# Patient Record
Sex: Female | Born: 1950 | Race: Black or African American | Hispanic: No | Marital: Married | Smoking: Never smoker
Health system: Southern US, Community
[De-identification: ages and names within clinical notes are randomized; demographics above are authoritative.]

## PROBLEM LIST (undated history)

## (undated) DIAGNOSIS — Z853 Personal history of malignant neoplasm of breast: Secondary | ICD-10-CM

## (undated) DIAGNOSIS — Z806 Family history of leukemia: Secondary | ICD-10-CM

## (undated) DIAGNOSIS — C801 Malignant (primary) neoplasm, unspecified: Secondary | ICD-10-CM

## (undated) DIAGNOSIS — Z803 Family history of malignant neoplasm of breast: Secondary | ICD-10-CM

## (undated) DIAGNOSIS — Z8042 Family history of malignant neoplasm of prostate: Secondary | ICD-10-CM

## (undated) HISTORY — DX: Family history of leukemia: Z80.6

## (undated) HISTORY — DX: Family history of malignant neoplasm of breast: Z80.3

## (undated) HISTORY — DX: Personal history of malignant neoplasm of breast: Z85.3

## (undated) HISTORY — DX: Family history of malignant neoplasm of prostate: Z80.42

---

## 2003-12-23 ENCOUNTER — Ambulatory Visit (HOSPITAL_COMMUNITY): Admission: RE | Admit: 2003-12-23 | Discharge: 2003-12-23 | Payer: Self-pay | Admitting: Gastroenterology

## 2006-09-06 ENCOUNTER — Encounter: Admission: RE | Admit: 2006-09-06 | Discharge: 2006-09-06 | Payer: Self-pay | Admitting: Cardiology

## 2007-04-26 ENCOUNTER — Emergency Department (HOSPITAL_COMMUNITY): Admission: EM | Admit: 2007-04-26 | Discharge: 2007-04-26 | Payer: Self-pay | Admitting: Emergency Medicine

## 2007-08-06 ENCOUNTER — Other Ambulatory Visit: Admission: RE | Admit: 2007-08-06 | Discharge: 2007-08-06 | Payer: Self-pay | Admitting: Obstetrics and Gynecology

## 2008-08-19 ENCOUNTER — Ambulatory Visit: Payer: Self-pay | Admitting: Family Medicine

## 2008-08-26 ENCOUNTER — Ambulatory Visit: Payer: Self-pay | Admitting: Family Medicine

## 2008-09-02 ENCOUNTER — Ambulatory Visit: Payer: Self-pay | Admitting: Family Medicine

## 2008-09-16 ENCOUNTER — Ambulatory Visit: Payer: Self-pay | Admitting: Family Medicine

## 2008-09-29 ENCOUNTER — Ambulatory Visit: Payer: Self-pay | Admitting: Family Medicine

## 2008-10-08 ENCOUNTER — Ambulatory Visit: Payer: Self-pay | Admitting: Family Medicine

## 2008-10-20 ENCOUNTER — Ambulatory Visit: Payer: Self-pay | Admitting: Family Medicine

## 2008-10-30 ENCOUNTER — Ambulatory Visit: Payer: Self-pay | Admitting: Family Medicine

## 2009-01-12 ENCOUNTER — Ambulatory Visit: Payer: Self-pay | Admitting: Family Medicine

## 2009-01-20 ENCOUNTER — Ambulatory Visit: Payer: Self-pay | Admitting: Family Medicine

## 2009-01-28 ENCOUNTER — Ambulatory Visit: Payer: Self-pay | Admitting: Family Medicine

## 2009-12-24 ENCOUNTER — Encounter: Admission: RE | Admit: 2009-12-24 | Discharge: 2009-12-24 | Payer: Self-pay | Admitting: Internal Medicine

## 2010-08-05 NOTE — Op Note (Signed)
Jeanette Krause, HALLINAN              ACCOUNT NO.:  [id number]   MEDICAL RECORD NO.:  [id number]          PATIENT TYPE:  AMB   LOCATION:  ENDO                         FACILITY:  [facility]   PHYSICIAN:  Graylin Shiver, M.D.   DATE OF BIRTH:  May 07, 1950   DATE OF PROCEDURE:  12/23/2003  DATE OF DISCHARGE:                                 OPERATIVE REPORT   PROCEDURE PERFORMED:  Colonoscopy.   INDICATIONS FOR PROCEDURE:  Screening.   Informed consent was obtained after explanation of the risks of bleeding,  infection, and perforation.   PREMEDICATIONS:  Fentanyl 75 mcg  IV, Versed 6 mg IV.   DESCRIPTION OF PROCEDURE:  With the patient in the left lateral decubitus  position, a rectal exam was performed and no masses were felt.  The Olympus  colonoscope was inserted into the rectum and advanced around the colon to  the cecum.  Cecal landmarks were identified.  The cecum and ascending colon  were normal.  The transverse colon normal.  The descending colon, sigmoid  and rectum were normal.  The patient tolerated the procedure well without  complications.   IMPRESSION:  Normal colonoscopy to the cecum.   I would recommend a follow-up colonoscopy again in five years due to the  patient's family history of colon polyps in her mother.      SFG/MEDQ  D:  12/23/2003  T:  12/23/2003  Job:  [id number]   cc:   Dellis Anes. Idell Pickles, M.D.  [address]  [place]  [place] [address]  Fax: [phone]

## 2011-05-19 ENCOUNTER — Other Ambulatory Visit: Payer: Self-pay | Admitting: Radiology

## 2011-05-19 DIAGNOSIS — C50911 Malignant neoplasm of unspecified site of right female breast: Secondary | ICD-10-CM

## 2011-05-25 ENCOUNTER — Other Ambulatory Visit: Payer: Self-pay

## 2012-01-01 ENCOUNTER — Other Ambulatory Visit (HOSPITAL_COMMUNITY)
Admission: RE | Admit: 2012-01-01 | Discharge: 2012-01-01 | Disposition: A | Discharge status: Home / Self Care | Payer: BC Managed Care – PPO | Source: Ambulatory Visit | Attending: Obstetrics and Gynecology | Admitting: Obstetrics and Gynecology

## 2012-01-01 DIAGNOSIS — Z01419 Encounter for gynecological examination (general) (routine) without abnormal findings: Secondary | ICD-10-CM | POA: Insufficient documentation

## 2012-04-17 IMAGING — CT CT ANGIO CHEST
2 of 6 series · 18 of 36 positions shown · IV contrast ([ID] OMNI 300)
Comparison: None.

CLINICAL DATA: Chest pain.

CT ANGIOGRAPHY CHEST WITH CONTRAST
TECHNIQUE: Multidetector CT imaging of the chest was performed
using the standard protocol during bolus administration of
intravenous contrast.  Multiplanar CT image reconstructions
including MIPs were obtained to evaluate the vascular anatomy.
Contrast:  100 ml Omnipaque 300 IV.

[Series 4: pe 1.25 · axial · 0.64mm/px · z∈[-241,-48]mm · 17 of 175 slices shown]
[im 10/175  lung]
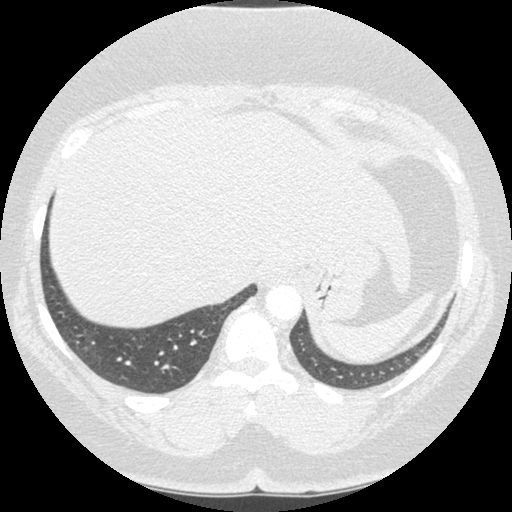
[im 20/175  mediastinal]
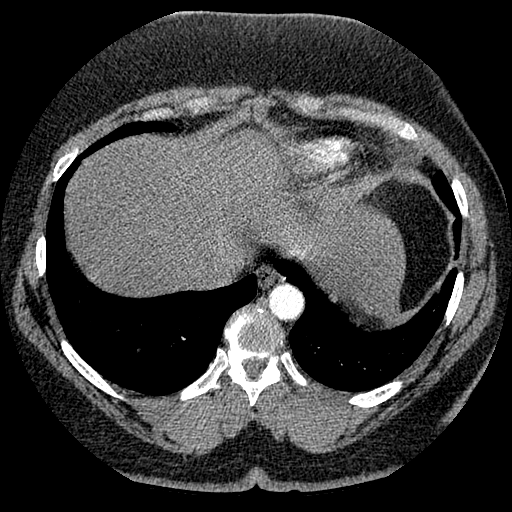
[im 30/175  lung]
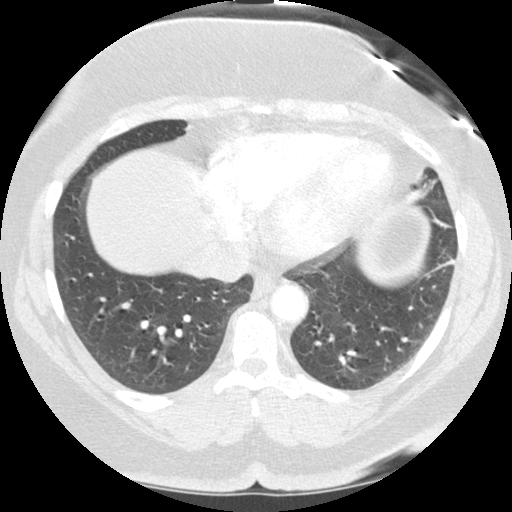
[im 39/175  mediastinal]
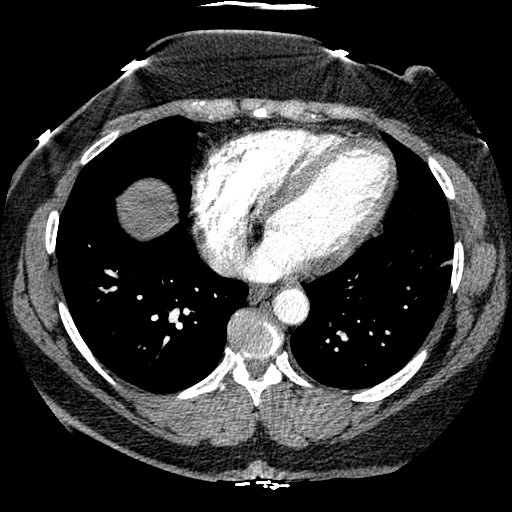
[im 49/175  lung]
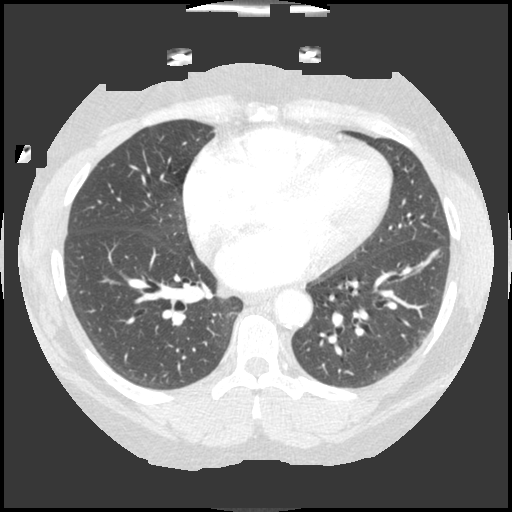
[im 59/175  mediastinal]
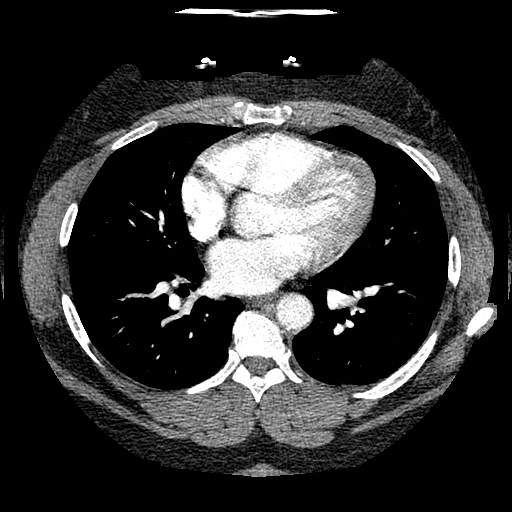
[im 68/175  lung]
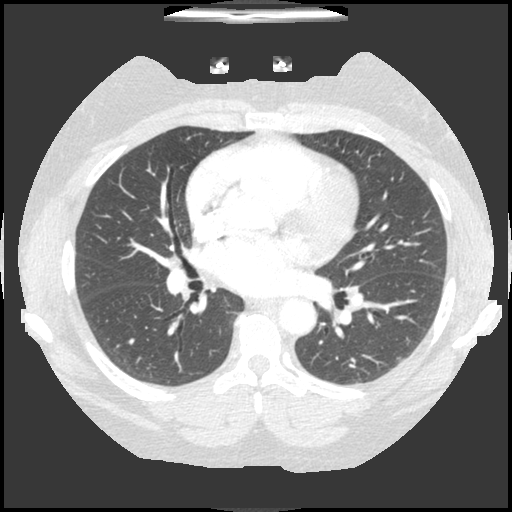
[im 78/175  mediastinal]
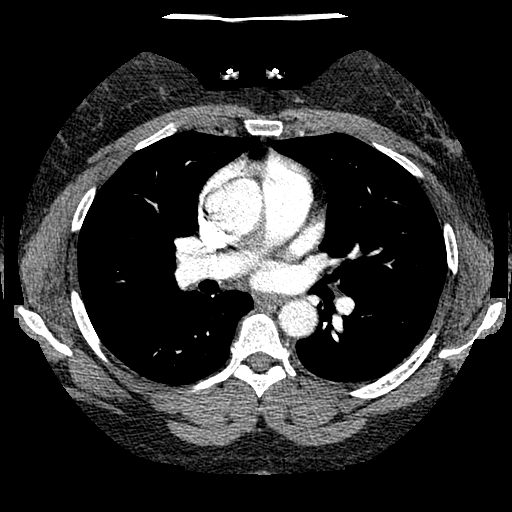
[im 88/175  lung]
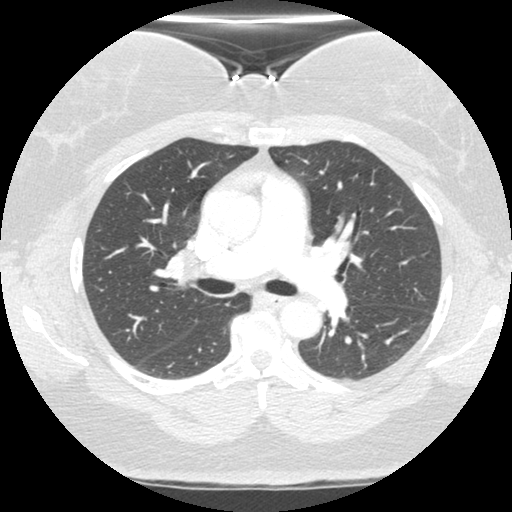
[im 97/175  mediastinal]
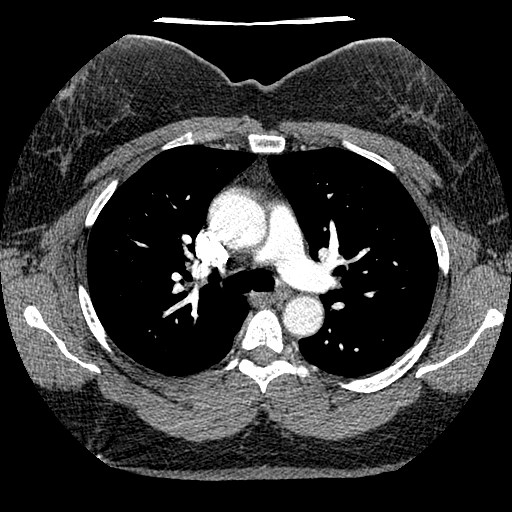
[im 107/175  lung]
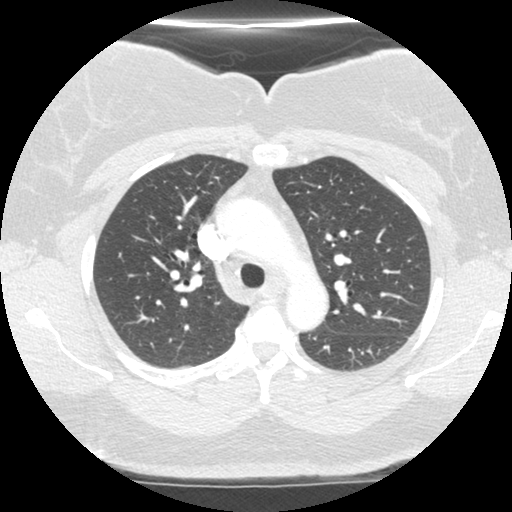
[im 117/175  mediastinal]
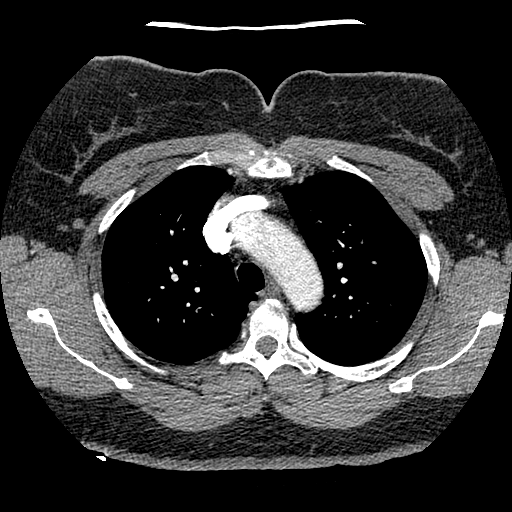
[im 126/175  lung]
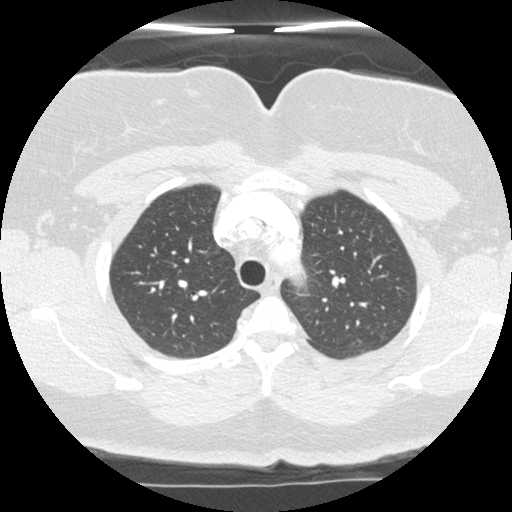
[im 136/175  mediastinal]
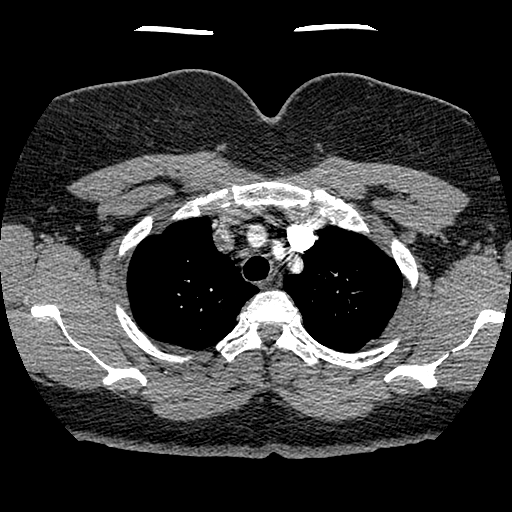
[im 146/175  lung]
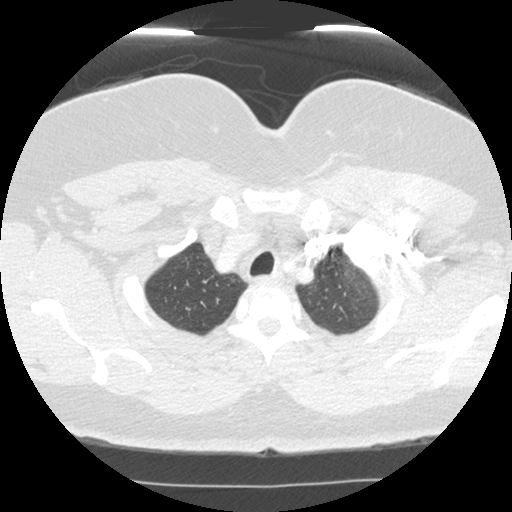
[im 155/175  mediastinal]
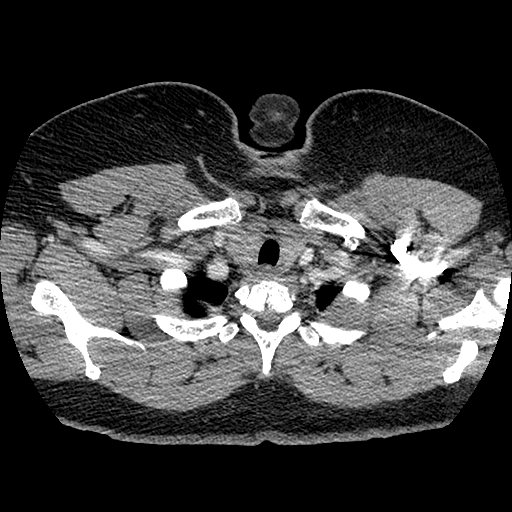
[im 165/175  lung]
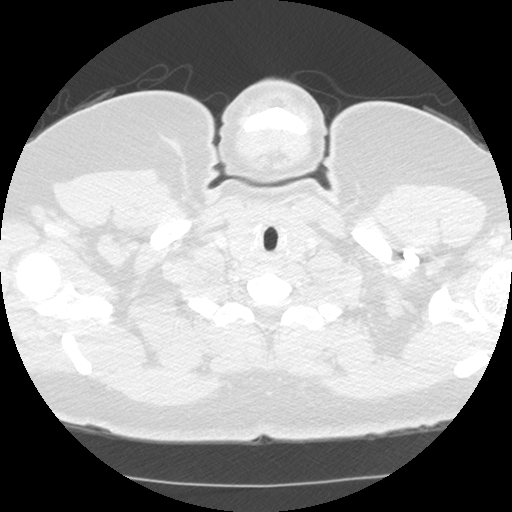

[Series 105: cor · coronal · 0.64mm/px · 1 of 141 slices shown]
[im 71/141  mediastinal]
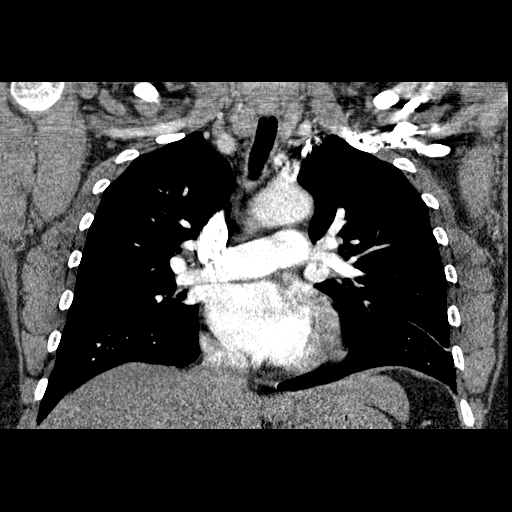

[18 of 36 positions shown; findings below may reference images not displayed]

FINDINGS: No filling defects in the pulmonary arteries to suggest
pulmonary emboli. Heart is normal size. Aorta is normal caliber. No
mediastinal, hilar, or axillary adenopathy.  Visualized thyroid and
chest wall soft tissues unremarkable. Linear densities are noted in
the left lung base compatible with scarring.  Lungs otherwise
clear.  No effusions.  There is

Imaging into the upper abdomen shows no acute findings.  No acute
bony abnormality.  Degenerative changes in the thoracic spine.

Review of the MIP images confirms the above findings.
IMPRESSION: No evidence of pulmonary embolus.

Minimal left base scarring.

No acute findings.

## 2014-02-24 ENCOUNTER — Other Ambulatory Visit: Payer: Self-pay | Admitting: Nurse Practitioner

## 2014-02-24 ENCOUNTER — Ambulatory Visit
Admission: RE | Admit: 2014-02-24 | Discharge: 2014-02-24 | Disposition: A | Discharge status: Home / Self Care | Payer: BC Managed Care – PPO | Source: Ambulatory Visit | Attending: Nurse Practitioner | Admitting: Nurse Practitioner

## 2014-02-24 DIAGNOSIS — J40 Bronchitis, not specified as acute or chronic: Secondary | ICD-10-CM

## 2014-08-12 ENCOUNTER — Other Ambulatory Visit (HOSPITAL_COMMUNITY)
Admission: RE | Admit: 2014-08-12 | Discharge: 2014-08-12 | Disposition: A | Discharge status: Home / Self Care | Payer: BC Managed Care – PPO | Source: Ambulatory Visit | Attending: Obstetrics and Gynecology | Admitting: Obstetrics and Gynecology

## 2014-08-12 ENCOUNTER — Other Ambulatory Visit: Payer: Self-pay | Admitting: Obstetrics and Gynecology

## 2014-08-12 DIAGNOSIS — R87810 Cervical high risk human papillomavirus (HPV) DNA test positive: Secondary | ICD-10-CM | POA: Insufficient documentation

## 2014-08-12 DIAGNOSIS — Z01419 Encounter for gynecological examination (general) (routine) without abnormal findings: Secondary | ICD-10-CM | POA: Insufficient documentation

## 2014-08-12 DIAGNOSIS — Z1151 Encounter for screening for human papillomavirus (HPV): Secondary | ICD-10-CM | POA: Diagnosis present

## 2014-08-13 LAB — CYTOLOGY - PAP

## 2015-08-18 ENCOUNTER — Other Ambulatory Visit: Payer: Self-pay | Admitting: Obstetrics and Gynecology

## 2015-08-18 ENCOUNTER — Other Ambulatory Visit (HOSPITAL_COMMUNITY)
Admission: RE | Admit: 2015-08-18 | Discharge: 2015-08-18 | Disposition: A | Discharge status: Home / Self Care | Payer: BC Managed Care – PPO | Source: Ambulatory Visit | Attending: Obstetrics and Gynecology | Admitting: Obstetrics and Gynecology

## 2015-08-18 DIAGNOSIS — Z01419 Encounter for gynecological examination (general) (routine) without abnormal findings: Secondary | ICD-10-CM | POA: Diagnosis not present

## 2015-08-19 LAB — CYTOLOGY - PAP

## 2015-11-05 ENCOUNTER — Encounter (HOSPITAL_COMMUNITY): Payer: Self-pay | Admitting: Emergency Medicine

## 2015-11-05 ENCOUNTER — Emergency Department (HOSPITAL_COMMUNITY)
Admission: EM | Admit: 2015-11-05 | Discharge: 2015-11-05 | Disposition: A | Discharge status: Home / Self Care | Payer: BC Managed Care – PPO | Attending: Emergency Medicine | Admitting: Emergency Medicine

## 2015-11-05 ENCOUNTER — Emergency Department (HOSPITAL_COMMUNITY): Payer: BC Managed Care – PPO

## 2015-11-05 DIAGNOSIS — R1011 Right upper quadrant pain: Secondary | ICD-10-CM | POA: Insufficient documentation

## 2015-11-05 DIAGNOSIS — Z853 Personal history of malignant neoplasm of breast: Secondary | ICD-10-CM | POA: Insufficient documentation

## 2015-11-05 DIAGNOSIS — Z91010 Allergy to peanuts: Secondary | ICD-10-CM | POA: Diagnosis not present

## 2015-11-05 DIAGNOSIS — R109 Unspecified abdominal pain: Secondary | ICD-10-CM | POA: Diagnosis present

## 2015-11-05 HISTORY — DX: Malignant (primary) neoplasm, unspecified: C80.1

## 2015-11-05 LAB — COMPREHENSIVE METABOLIC PANEL
ALK PHOS: 69 U/L (ref 38–126)
ALT: 51 U/L (ref 14–54)
ANION GAP: 5 (ref 5–15)
AST: 49 U/L — ABNORMAL HIGH (ref 15–41)
Albumin: 3.6 g/dL (ref 3.5–5.0)
BILIRUBIN TOTAL: 0.2 mg/dL — AB (ref 0.3–1.2)
BUN: 8 mg/dL (ref 6–20)
CALCIUM: 9.6 mg/dL (ref 8.9–10.3)
CO2: 26 mmol/L (ref 22–32)
Chloride: 107 mmol/L (ref 101–111)
Creatinine, Ser: 0.92 mg/dL (ref 0.44–1.00)
GLUCOSE: 120 mg/dL — AB (ref 65–99)
Potassium: 4.1 mmol/L (ref 3.5–5.1)
Sodium: 138 mmol/L (ref 135–145)
TOTAL PROTEIN: 8.2 g/dL — AB (ref 6.5–8.1)

## 2015-11-05 LAB — URINALYSIS, ROUTINE W REFLEX MICROSCOPIC
BILIRUBIN URINE: NEGATIVE
Glucose, UA: NEGATIVE mg/dL
Hgb urine dipstick: NEGATIVE
Ketones, ur: NEGATIVE mg/dL
LEUKOCYTES UA: NEGATIVE
NITRITE: NEGATIVE
Protein, ur: NEGATIVE mg/dL
SPECIFIC GRAVITY, URINE: 1.016 (ref 1.005–1.030)
pH: 7 (ref 5.0–8.0)

## 2015-11-05 LAB — CBC
HCT: 34.5 % — ABNORMAL LOW (ref 36.0–46.0)
HEMOGLOBIN: 11.8 g/dL — AB (ref 12.0–15.0)
MCH: 32.6 pg (ref 26.0–34.0)
MCHC: 34.2 g/dL (ref 30.0–36.0)
MCV: 95.3 fL (ref 78.0–100.0)
Platelets: 260 10*3/uL (ref 150–400)
RBC: 3.62 MIL/uL — AB (ref 3.87–5.11)
RDW: 13.6 % (ref 11.5–15.5)
WBC: 8 10*3/uL (ref 4.0–10.5)

## 2015-11-05 LAB — LIPASE, BLOOD: Lipase: 21 U/L (ref 11–51)

## 2015-11-05 MED ORDER — ONDANSETRON HCL 4 MG/2ML IJ SOLN
4.0000 mg | Freq: Once | INTRAMUSCULAR | Status: AC
  Administered 2015-11-05: 4 mg via INTRAVENOUS
  Filled 2015-11-05: qty 2

## 2015-11-05 MED ORDER — ONDANSETRON HCL 4 MG PO TABS: 4.0000 mg | ORAL_TABLET | Freq: Three times a day (TID) | ORAL | 0 refills | Status: AC | PRN

## 2015-11-05 MED ORDER — SODIUM CHLORIDE 0.9 % IV BOLUS (SEPSIS)
1000.0000 mL | Freq: Once | INTRAVENOUS | Status: AC
  Administered 2015-11-05: 1000 mL via INTRAVENOUS

## 2015-11-05 MED ORDER — FENTANYL CITRATE (PF) 100 MCG/2ML IJ SOLN
100.0000 ug | Freq: Once | INTRAMUSCULAR | Status: AC
  Administered 2015-11-05: 100 ug via INTRAVENOUS
  Filled 2015-11-05: qty 2

## 2015-11-05 NOTE — ED Provider Notes (Addendum)
[facility] DEPT Provider Note   CSN: [id number] Arrival date & time: 11/05/15  1240     History   Chief Complaint Chief Complaint  Patient presents with  . Abdominal Pain    HPI Jeanette Krause is a 65 y.o. female.  65 year old female history of breast cancer presented to the emergency department today with less than 12 hours of right-sided abdominal pain associated with 3 episodes of vomiting and decreased appetite. Took some Gas-X with moderate relief still rates the pain as a 4 out of 10 on my evaluation. Last normal bowel movement was this morning. She has felt chills but no measured fevers. No other associated symptoms. Pain seems to be worse when she is walking around she also seems to be little bit more weak than normal. No history of the same. No other modifying factors.    Abdominal Pain   Associated symptoms include vomiting. Pertinent negatives include fever, diarrhea, nausea and constipation.    Past Medical History:  Diagnosis Date  . Cancer [facility])    breast    There are no active problems to display for this patient.   History reviewed. No pertinent surgical history.  OB History    No data available       Home Medications    Prior to Admission medications   Medication Sig Start Date End Date Taking? Authorizing Provider  Ascorbic Acid (VITAMIN C) 1000 MG tablet Take 1,000 mg by mouth daily.   Yes Historical Provider, MD  Calcium-Vitamin D-Vitamin K (CALCIUM SOFT CHEWS) 500-500-40 MG-UNT-MCG CHEW Chew 2 tablets by mouth daily.   Yes Historical Provider, MD  cetirizine (ZYRTEC) 10 MG tablet Take 10 mg by mouth daily.   Yes Historical Provider, MD  Cholecalciferol (VITAMIN D3) 10000 units TABS Take 5 tablets by mouth once a week.    Yes Historical Provider, MD  CHROMIUM PO Take 600 mg by mouth daily.   Yes Historical Provider, MD  Coenzyme Q10 (COQ-10) 50 MG CAPS Take 100 mg by mouth daily.   Yes Historical Provider, MD  fluticasone (FLONASE) 50  MCG/ACT nasal spray Place 1-2 sprays into both nostrils daily.   Yes Historical Provider, MD  letrozole (FEMARA) 2.5 MG tablet Take 2.5 mg by mouth daily. 10/29/15  Yes Historical Provider, MD  levothyroxine (SYNTHROID, LEVOTHROID) 100 MCG tablet Take 100 mcg by mouth every morning.   Yes Historical Provider, MD  MAGNESIUM GLYCINATE PLUS PO Take 1 capsule by mouth daily.   Yes Historical Provider, MD  Menaquinone-7 (VITAMIN K2) 40 MCG TABS Take 40 mcg by mouth daily.   Yes Historical Provider, MD  Multiple Vitamin (MULTI-VITAMINS) TABS Take 1 tablet by mouth daily with breakfast.   Yes Historical Provider, MD  Probiotic CAPS Take 1 capsule by mouth daily.   Yes Historical Provider, MD  selenium 50 MCG TABS tablet Take 50 mcg by mouth daily.   Yes Historical Provider, MD  VITAMIN A PO Take 4 capsules by mouth daily.   Yes Historical Provider, MD    Family History No family history on file.  Social History Social History  Substance Use Topics  . Smoking status: Never Smoker  . Smokeless tobacco: Never Used  . Alcohol use Not on file     Allergies   Fruit extracts; Pollen extract; Shellfish allergy; Peanut-containing drug products; and Penicillins   Review of Systems Review of Systems  Constitutional: Positive for chills. Negative for fever.  Respiratory: Negative for cough and shortness of breath.   Gastrointestinal:  Positive for abdominal pain and vomiting. Negative for constipation, diarrhea and nausea.  All other systems reviewed and are negative.    Physical Exam Updated Vital Signs BP 140/91   Pulse 80   Temp 98.6 F (37 C) (Oral)   Resp 18   SpO2 99%   Physical Exam  Constitutional: She appears well-developed and well-nourished. No distress.  HENT:  Head: Normocephalic and atraumatic.  Eyes: Conjunctivae are normal.  Neck: Neck supple.  Cardiovascular: Normal rate and regular rhythm.   No murmur heard. Pulmonary/Chest: Effort normal and breath sounds normal. No  respiratory distress.  Abdominal: Soft. She exhibits no distension and no mass. There is tenderness (RUQ). There is guarding (slight involuntary). There is no rebound.  Musculoskeletal: She exhibits no edema.  Neurological: She is alert.  Skin: Skin is warm and dry. No rash noted. No erythema.  Psychiatric: She has a normal mood and affect.  Nursing note and vitals reviewed.    ED Treatments / Results  Labs (all labs ordered are listed, but only abnormal results are displayed) Labs Reviewed  COMPREHENSIVE METABOLIC PANEL - Abnormal; Notable for the following:       Result Value   Glucose, Bld 120 (*)    Total Protein 8.2 (*)    AST 49 (*)    Total Bilirubin 0.2 (*)    All other components within normal limits  CBC - Abnormal; Notable for the following:    RBC 3.62 (*)    Hemoglobin 11.8 (*)    HCT 34.5 (*)    All other components within normal limits  URINALYSIS, ROUTINE W REFLEX MICROSCOPIC (NOT AT [facility]) - Abnormal; Notable for the following:    APPearance CLOUDY (*)    All other components within normal limits  LIPASE, BLOOD    EKG  EKG Interpretation None       Radiology US Abdomen Limited Ruq  Result Date: 11/05/2015 CLINICAL DATA:  Right upper quadrant pain. EXAM: US ABDOMEN LIMITED - RIGHT UPPER QUADRANT COMPARISON:  09/06/2006. FINDINGS: Gallbladder: No gallstones or wall thickening visualized. Gallbladder folds noted. No sonographic Murphy sign noted by sonographer. Common bile duct: Diameter: 4.4 mm Liver: There is again noted to be echogenic consistent fatty infiltration and/or hepatocellular disease. No focal hepatic abnormality identified. IMPRESSION: The liver is again noted to be echogenic consistent fatty infiltration and/or hepatocellular disease. Exam is otherwise unremarkable. No gallstones or biliary distention. Electronically Signed   By: Marcello Moores  Register   On: 11/05/2015 17:00    Procedures Procedures (including critical care time)  Medications  Ordered in ED Medications  sodium chloride 0.9 % bolus 1,000 mL (1,000 mLs Intravenous New Bag/Given 11/05/15 1612)  fentaNYL (SUBLIMAZE) injection 100 mcg (100 mcg Intravenous Given 11/05/15 1612)  ondansetron (ZOFRAN) injection 4 mg (4 mg Intravenous Given 11/05/15 1612)     Initial Impression / Assessment and Plan / ED Course  I have reviewed the triage vital signs and the nursing notes.  Pertinent labs & imaging results that were available during my care of the patient were reviewed by me and considered in my medical decision making (see chart for details).  Gas vs biliary colic. Plan for labs/pain meds/fluids/US. If all ok will reeval for relief. Less likely appendicitis at this time with no RLQ ttp.   Clinical Course    Reevaluation, pain dissipated. Abdomen benign. [place] negative. Labs ok. Still doubt appendicitis However the patient will return here if the pain comes back, worsens, persistent vomiting, fever, chills or lethargy.  I will discharge her home with prescription for Zofran with PCP follow-up.  Final Clinical Impressions(s) / ED Diagnoses   Final diagnoses:  RUQ abdominal pain  Right upper quadrant pain    New Prescriptions New Prescriptions   No medications on file     Merrily Pew, MD 11/05/15 1752

## 2015-11-05 NOTE — ED Triage Notes (Signed)
Rt side abd pain since this am vomiting hurts to walk last bm tthis am good denies dysuria

## 2015-11-05 NOTE — ED Notes (Signed)
Dr. Mesner at bedside   

## 2016-06-18 IMAGING — CR DG CHEST 2V
2 series · 2 of 2 positions shown · non-contrast
Comparison: 12/24/2009

CLINICAL DATA: Cough and wheezing for 3 weeks

EXAM:
CHEST  2 VIEW

[w chest pa *]
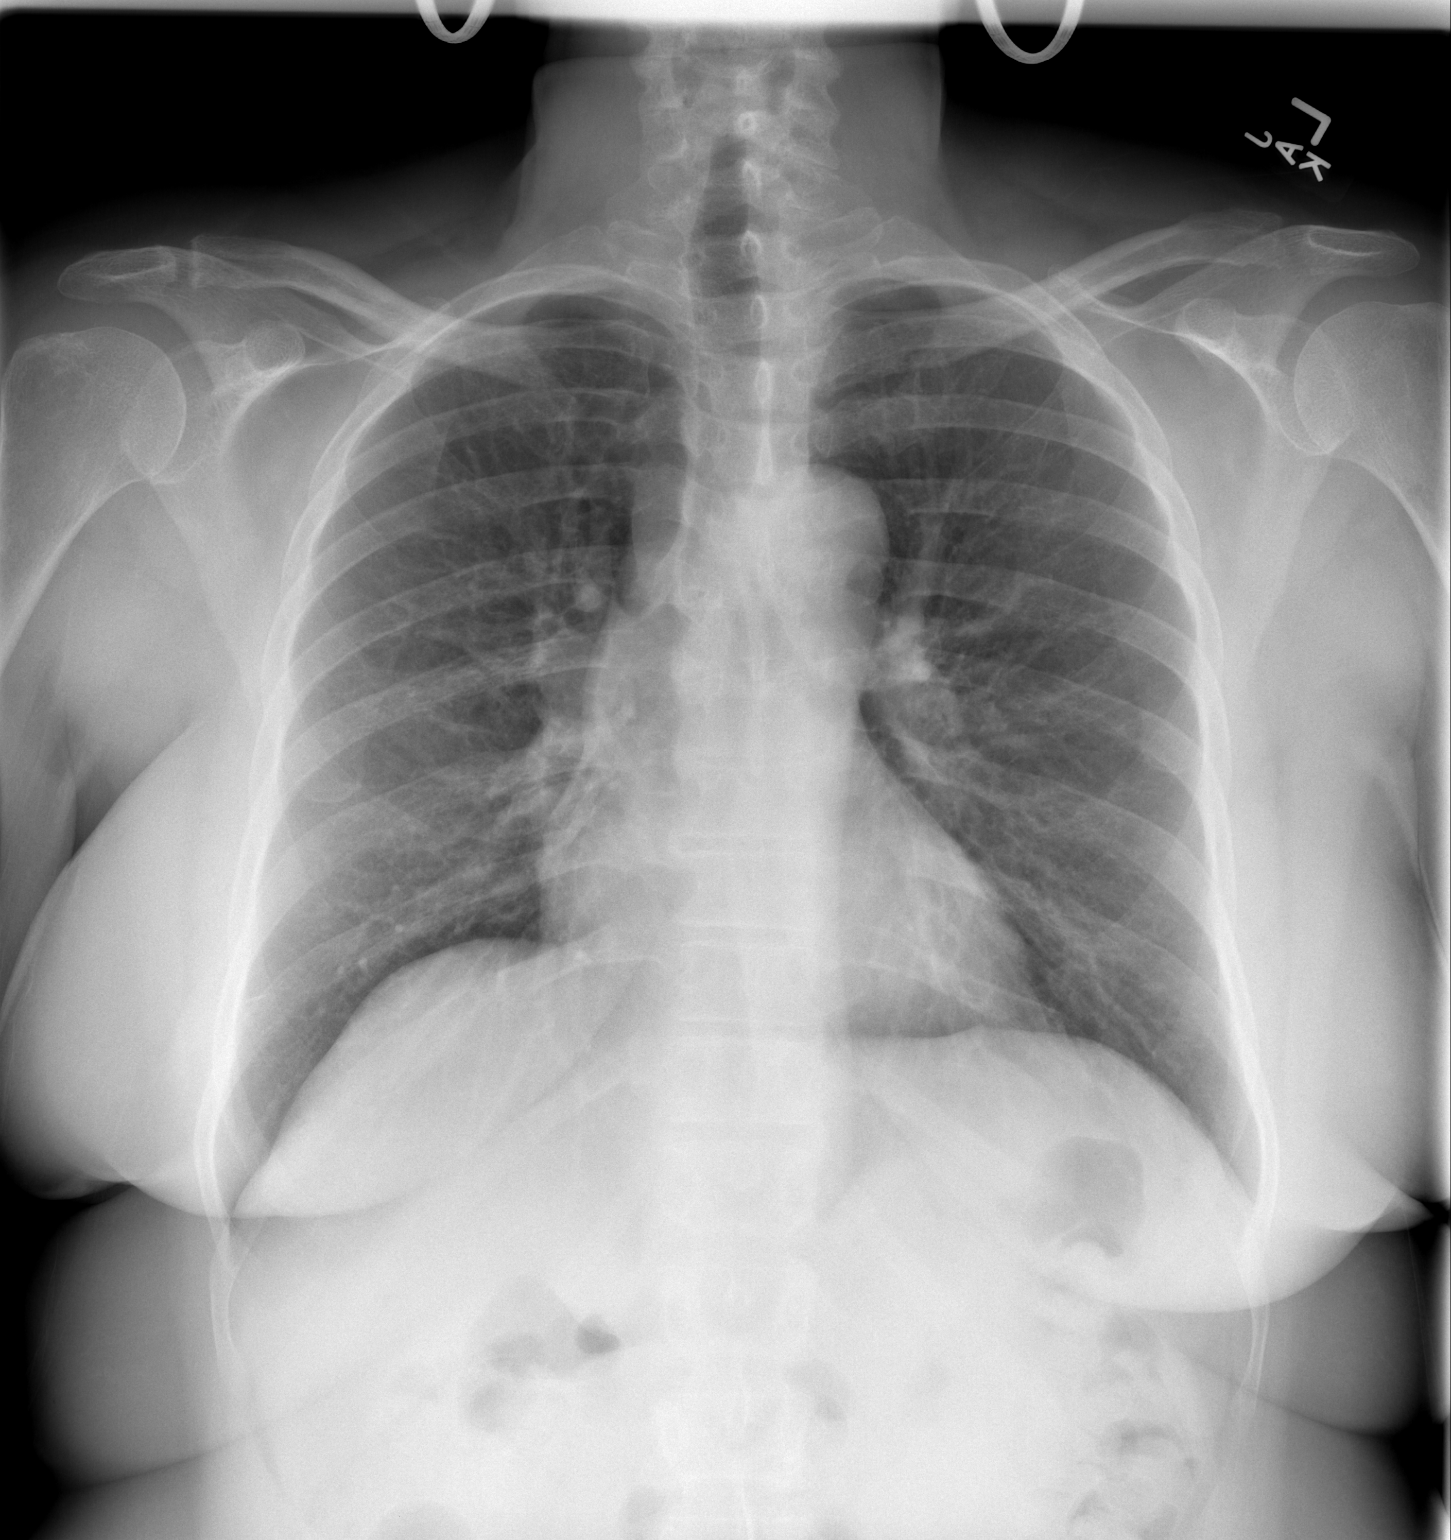

[w chest lat]
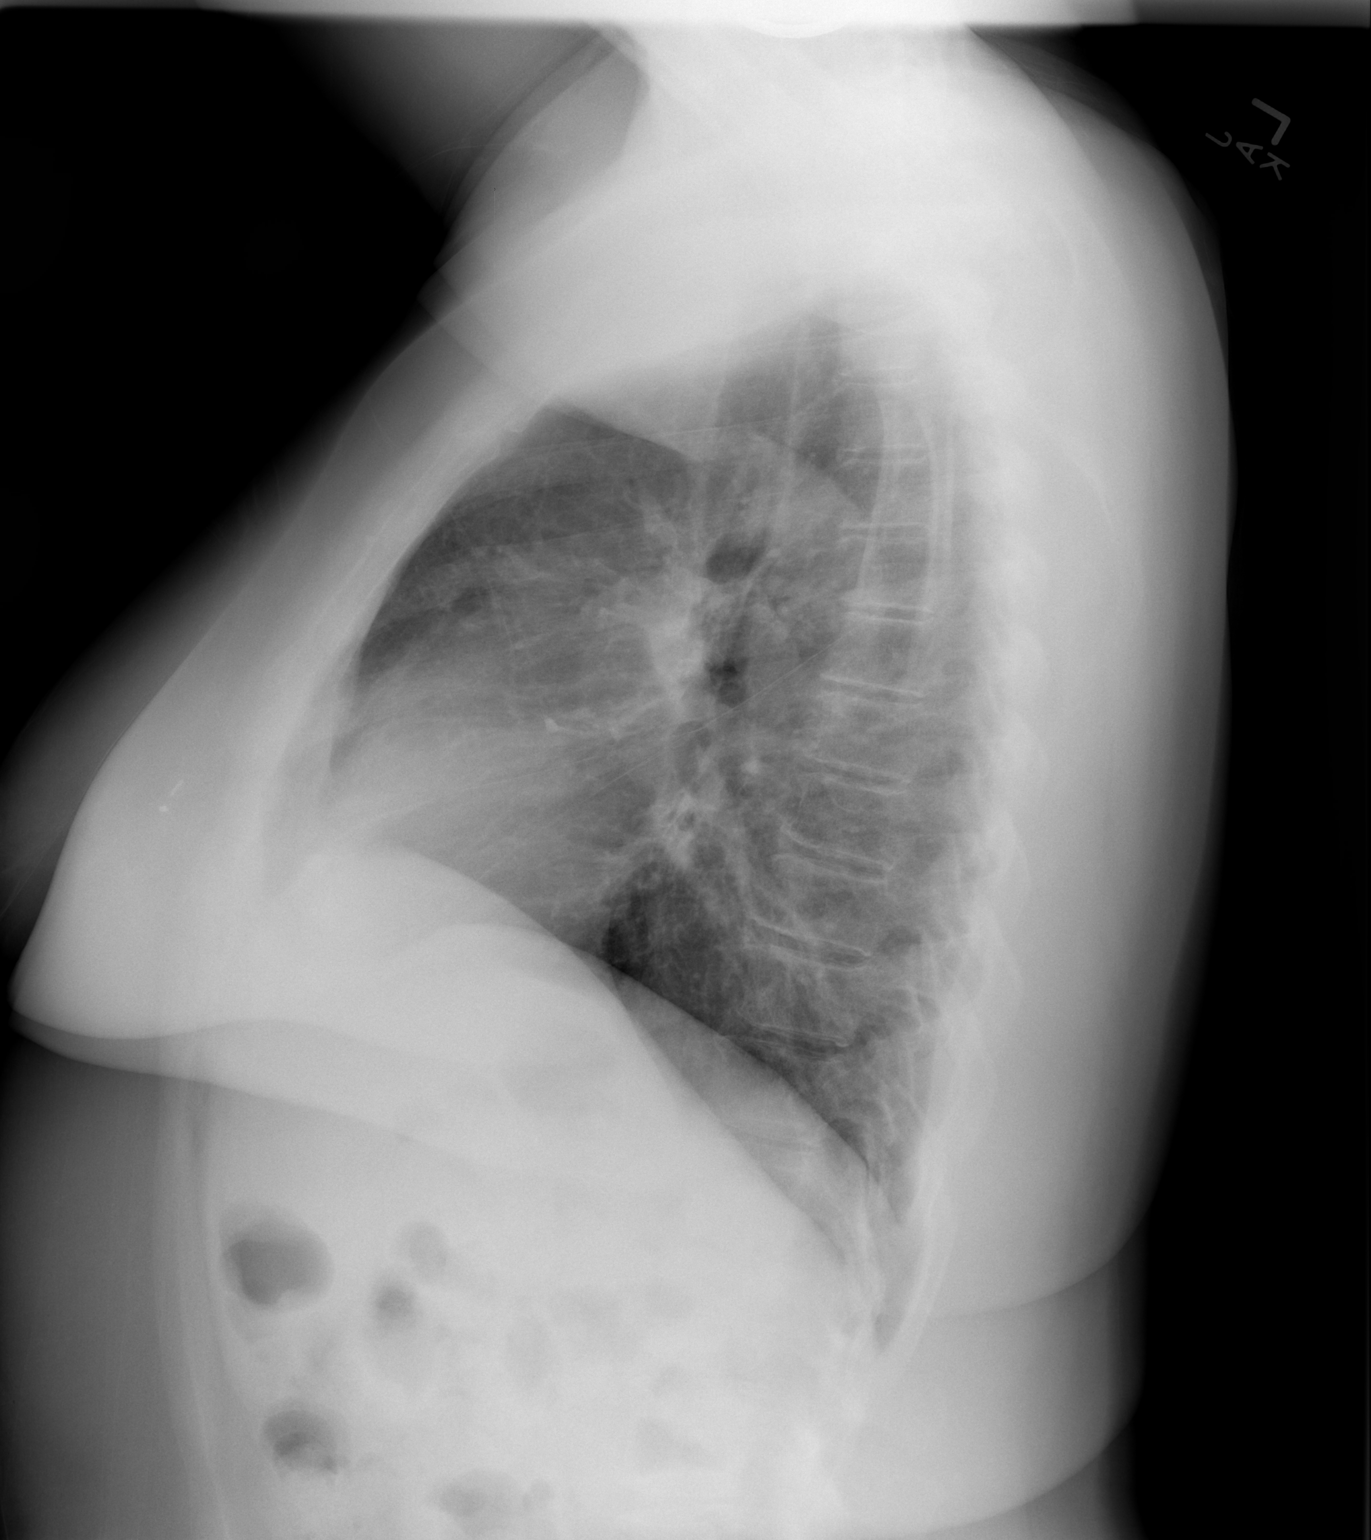

[2 of 2 positions shown; findings below may reference images not displayed]

FINDINGS: The heart size and mediastinal contours are within normal limits.
Both lungs are clear. The visualized skeletal structures are
unremarkable.
IMPRESSION: No active cardiopulmonary disease.

## 2016-08-21 ENCOUNTER — Other Ambulatory Visit (HOSPITAL_COMMUNITY)
Admission: RE | Admit: 2016-08-21 | Discharge: 2016-08-21 | Disposition: A | Discharge status: Home / Self Care | Payer: BC Managed Care – PPO | Source: Ambulatory Visit | Attending: Obstetrics and Gynecology | Admitting: Obstetrics and Gynecology

## 2016-08-21 ENCOUNTER — Other Ambulatory Visit: Payer: Self-pay | Admitting: Obstetrics and Gynecology

## 2016-08-21 DIAGNOSIS — Z01419 Encounter for gynecological examination (general) (routine) without abnormal findings: Secondary | ICD-10-CM | POA: Insufficient documentation

## 2016-08-23 LAB — CYTOLOGY - PAP: DIAGNOSIS: NEGATIVE

## 2016-12-05 IMAGING — US US ABDOMEN LIMITED
1 series · 14 of 25 positions shown · non-contrast
Comparison: 09/06/2006.

CLINICAL DATA: Right upper quadrant pain.

EXAM:
US ABDOMEN LIMITED - RIGHT UPPER QUADRANT

[Series 1: us abdomen limited · 0.25mm/px · 14 of 52 slices shown]
[im 1/52]
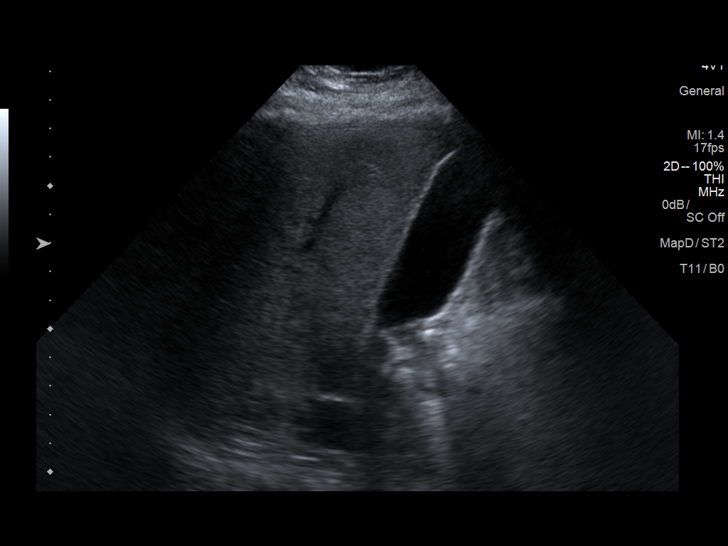
[im 5/52]
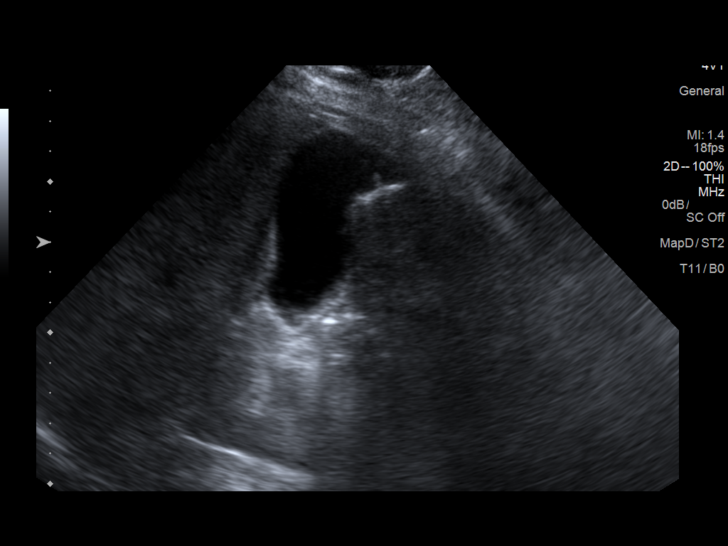
[im 9/52]
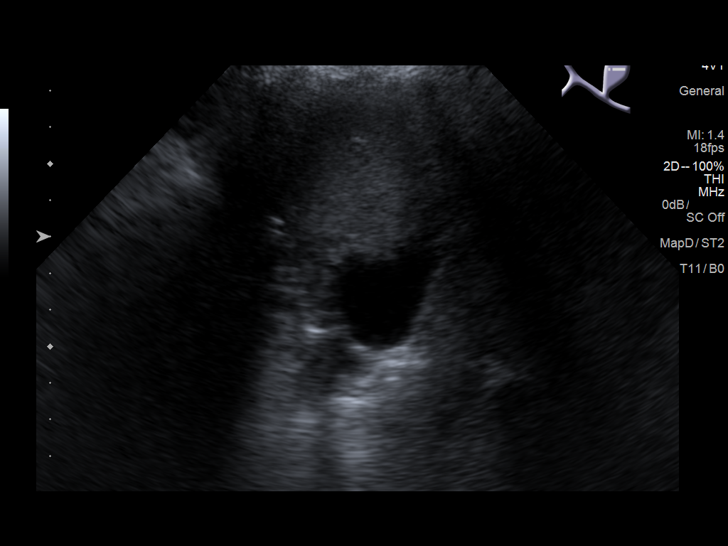
[im 13/52]
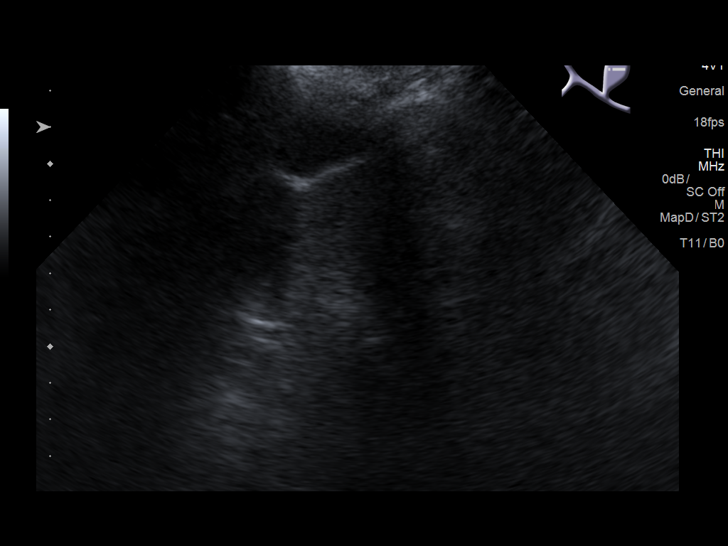
[im 18/52]
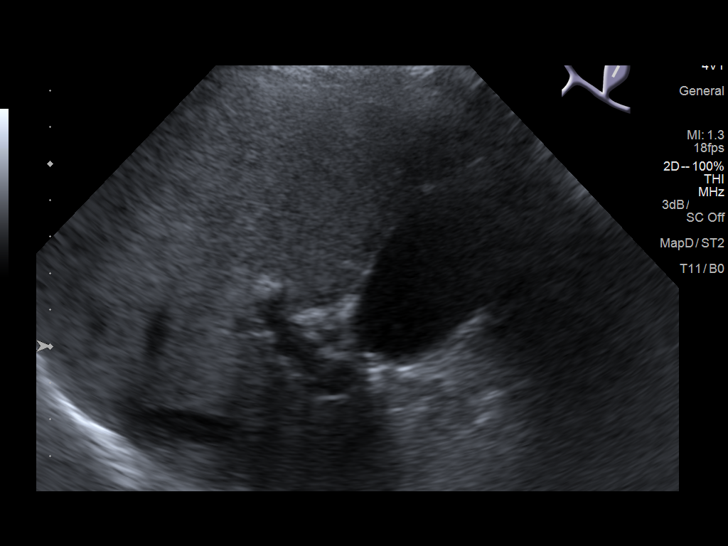
[im 20/52]
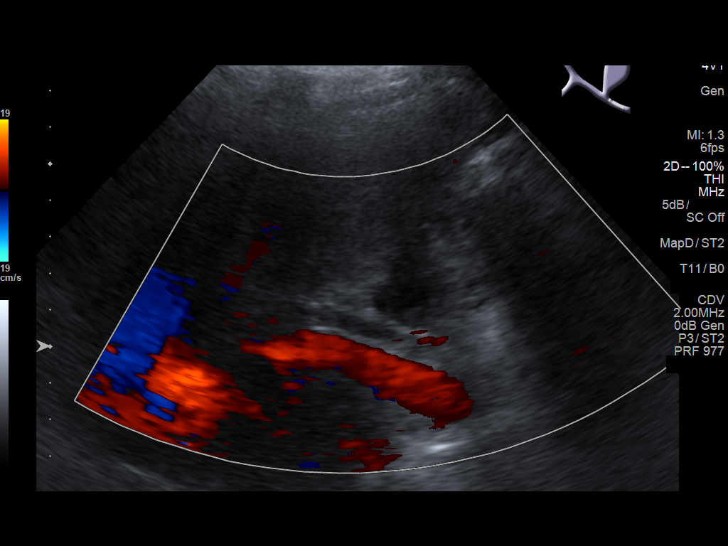
[im 24/52]
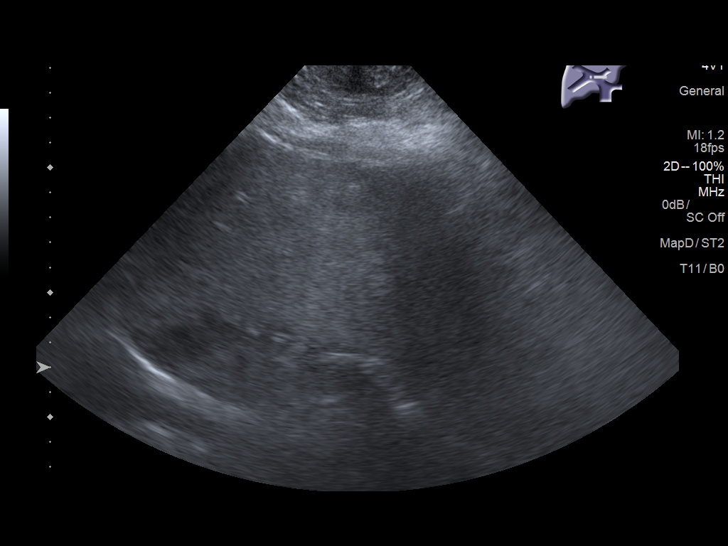
[im 28/52]
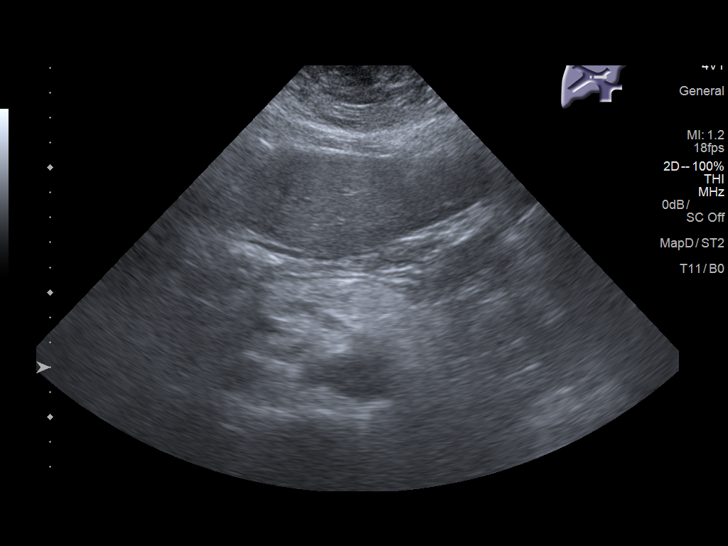
[im 32/52]
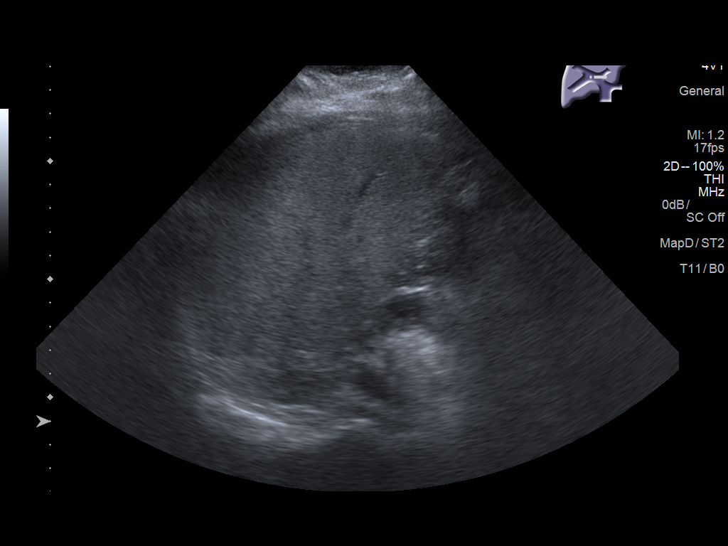
[im 35/52]
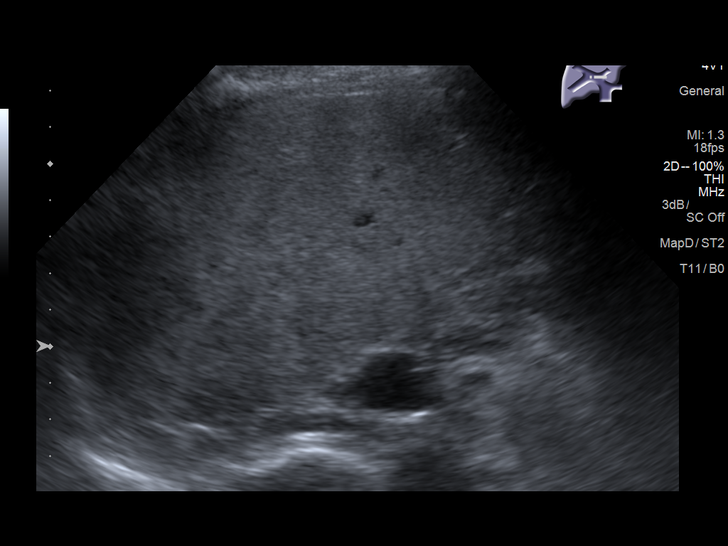
[im 39/52]
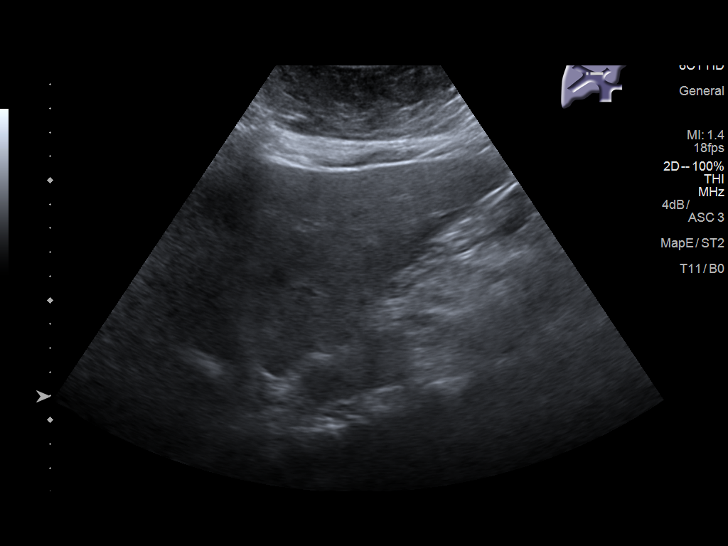
[im 43/52]
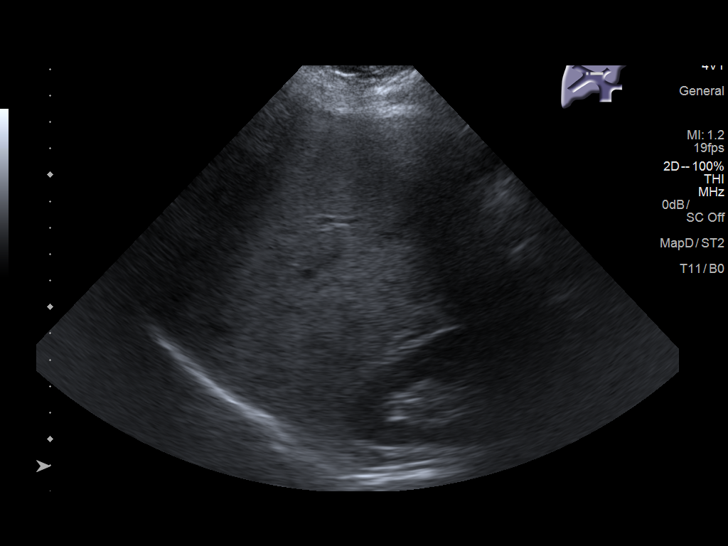
[im 47/52]
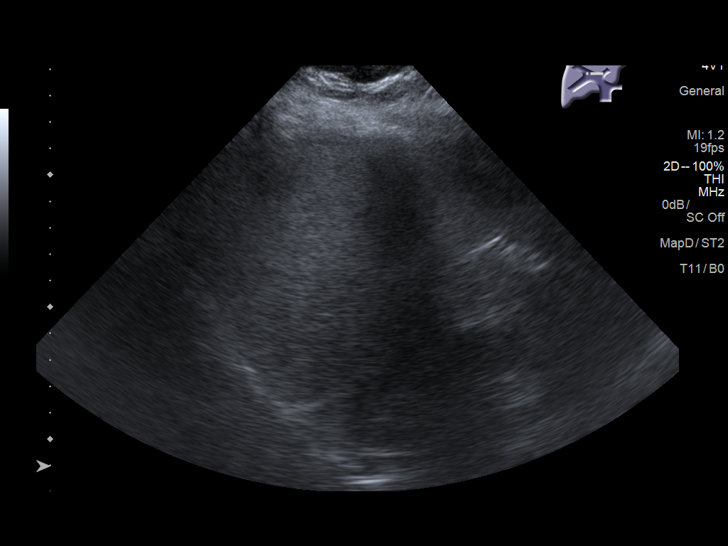
[im 52/52]
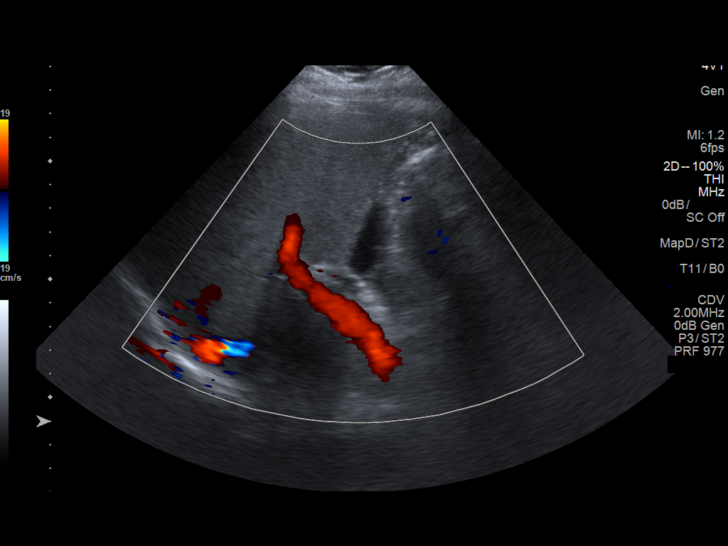

[14 of 25 positions shown; findings below may reference images not displayed]

FINDINGS: Gallbladder:

No gallstones or wall thickening visualized. Gallbladder folds
noted. No sonographic Murphy sign noted by sonographer.

Common bile duct:

Diameter: 4.4 mm

Liver:

There is again noted to be echogenic consistent fatty infiltration
and/or hepatocellular disease. No focal hepatic abnormality
identified.
IMPRESSION: The liver is again noted to be echogenic consistent fatty
infiltration and/or hepatocellular disease. Exam is otherwise
unremarkable. No gallstones or biliary distention.

## 2017-08-22 ENCOUNTER — Other Ambulatory Visit: Payer: Self-pay | Admitting: Obstetrics and Gynecology

## 2017-08-22 ENCOUNTER — Other Ambulatory Visit (HOSPITAL_COMMUNITY)
Admission: RE | Admit: 2017-08-22 | Discharge: 2017-08-22 | Disposition: A | Discharge status: Home / Self Care | Payer: BC Managed Care – PPO | Source: Ambulatory Visit | Attending: Obstetrics and Gynecology | Admitting: Obstetrics and Gynecology

## 2017-08-22 DIAGNOSIS — Z01411 Encounter for gynecological examination (general) (routine) with abnormal findings: Secondary | ICD-10-CM | POA: Diagnosis present

## 2017-08-24 LAB — CYTOLOGY - PAP
DIAGNOSIS: NEGATIVE
HPV 16/18/45 GENOTYPING: NEGATIVE
HPV: DETECTED — AB

## 2018-11-05 ENCOUNTER — Other Ambulatory Visit: Payer: Self-pay | Admitting: Obstetrics and Gynecology

## 2018-11-05 ENCOUNTER — Other Ambulatory Visit (HOSPITAL_COMMUNITY)
Admission: RE | Admit: 2018-11-05 | Discharge: 2018-11-05 | Disposition: A | Discharge status: Home / Self Care | Payer: Medicare Other | Source: Ambulatory Visit | Attending: Obstetrics and Gynecology | Admitting: Obstetrics and Gynecology

## 2018-11-05 DIAGNOSIS — Z01419 Encounter for gynecological examination (general) (routine) without abnormal findings: Secondary | ICD-10-CM | POA: Insufficient documentation

## 2018-11-05 DIAGNOSIS — Z1151 Encounter for screening for human papillomavirus (HPV): Secondary | ICD-10-CM | POA: Insufficient documentation

## 2018-11-06 LAB — CYTOLOGY - PAP
Diagnosis: NEGATIVE
HPV: NOT DETECTED

## 2019-12-26 ENCOUNTER — Telehealth: Payer: Self-pay | Admitting: Genetic Counselor

## 2019-12-26 NOTE — Telephone Encounter (Signed)
Received a genetic counseling referral from Dr. Landry Mellow at [facility]. Ms. Mccleave has been cld and scheduled to see Raquel Sarna on 10/28 at 1pm. Letter mailed.

## 2020-01-15 ENCOUNTER — Inpatient Hospital Stay: Payer: Medicare PPO | Attending: Genetic Counselor | Admitting: Genetic Counselor

## 2020-01-15 ENCOUNTER — Other Ambulatory Visit: Payer: Self-pay

## 2020-01-15 ENCOUNTER — Inpatient Hospital Stay: Payer: Medicare PPO

## 2020-01-15 DIAGNOSIS — Z803 Family history of malignant neoplasm of breast: Secondary | ICD-10-CM

## 2020-01-15 DIAGNOSIS — Z853 Personal history of malignant neoplasm of breast: Secondary | ICD-10-CM

## 2020-01-15 DIAGNOSIS — Z8042 Family history of malignant neoplasm of prostate: Secondary | ICD-10-CM | POA: Diagnosis not present

## 2020-01-15 DIAGNOSIS — Z806 Family history of leukemia: Secondary | ICD-10-CM

## 2020-01-15 DIAGNOSIS — Z1379 Encounter for other screening for genetic and chromosomal anomalies: Secondary | ICD-10-CM | POA: Diagnosis not present

## 2020-01-16 ENCOUNTER — Encounter: Payer: Self-pay | Admitting: Genetic Counselor

## 2020-01-16 DIAGNOSIS — Z803 Family history of malignant neoplasm of breast: Secondary | ICD-10-CM | POA: Insufficient documentation

## 2020-01-16 DIAGNOSIS — Z853 Personal history of malignant neoplasm of breast: Secondary | ICD-10-CM | POA: Insufficient documentation

## 2020-01-16 DIAGNOSIS — Z806 Family history of leukemia: Secondary | ICD-10-CM | POA: Insufficient documentation

## 2020-01-16 DIAGNOSIS — Z8042 Family history of malignant neoplasm of prostate: Secondary | ICD-10-CM | POA: Insufficient documentation

## 2020-01-16 NOTE — Progress Notes (Signed)
REFERRING PROVIDER: Christophe Louis, MD [address] [place],  [place] [address]  PRIMARY PROVIDER:  No primary care provider on file.  PRIMARY REASON FOR VISIT:  1. History of breast cancer   2. Family history of prostate cancer   3. Family history of breast cancer   4. Family history of leukemia      HISTORY OF PRESENT ILLNESS:   Ms. Cutbirth, a 69 y.o. female, was seen for a [facility] cancer genetics consultation at the request of Dr. Landry Mellow due to a personal and family history of cancer.  Ms. Stahle presents to clinic today to discuss the possibility of a hereditary predisposition to cancer, genetic testing, and to further clarify her future cancer risks, as well as potential cancer risks for family members.   In 2013, at the age of 26, Ms. Robello was diagnosed with invasive ductal carcinoma, ER+/PR+/Her2-, of the right breast. The treatment plan included lumpectomy, chemotherapy, radiation therapy, and letrozole.   RISK FACTORS:  Menarche was at age 81.  First live birth at age 86.  OCP use for approximately 10 years.  Ovaries intact: yes.  Hysterectomy: no.  Menopausal status: postmenopausal.  HRT use: 0 years. Colonoscopy: yes; last in 2019; 3 cumulative polyps per patient. Up to date with pelvic exams: yes. Any excessive radiation exposure in the past: radiation for treatment of right breast cancer.   Past Medical History:  Diagnosis Date  . Cancer [facility])    breast  . Family history of breast cancer   . Family history of leukemia   . Family history of prostate cancer   . History of breast cancer     No past surgical history on file.  Social History   Socioeconomic History  . Marital status: Married    Spouse name: Not on file  . Number of children: Not on file  . Years of education: Not on file  . Highest education level: Not on file  Occupational History  . Not on file  Tobacco Use  . Smoking status: Never Smoker  . Smokeless tobacco: Never Used    Substance and Sexual Activity  . Alcohol use: Not on file  . Drug use: Not on file  . Sexual activity: Not on file  Other Topics Concern  . Not on file  Social History Narrative  . Not on file   Social Determinants of Health   Financial Resource Strain:   . Difficulty of Paying Living Expenses: Not on file  Food Insecurity:   . Worried About Charity fundraiser in the Last Year: Not on file  . Ran Out of Food in the Last Year: Not on file  Transportation Needs:   . Lack of Transportation (Medical): Not on file  . Lack of Transportation (Non-Medical): Not on file  Physical Activity:   . Days of Exercise per Week: Not on file  . Minutes of Exercise per Session: Not on file  Stress:   . Feeling of Stress : Not on file  Social Connections:   . Frequency of Communication with Friends and Family: Not on file  . Frequency of Social Gatherings with Friends and Family: Not on file  . Attends Religious Services: Not on file  . Active Member of Clubs or Organizations: Not on file  . Attends Archivist Meetings: Not on file  . Marital Status: Not on file     FAMILY HISTORY:  We obtained a detailed, 4-generation family history.  Significant  diagnoses are listed below: Family History  Problem Relation Age of Onset  . Sickle cell anemia Father   . Diabetes Sister   . Heart disease Maternal Aunt   . Goiter Paternal Grandmother   . Cancer Other        unknown type dx >50, PGM's sister  . Aortic dissection Sister   . Transient ischemic attack Sister        x6  . Heart attack Sister 73  . Prostate cancer Cousin 41       metastatic, paternal first cousin  . Cancer Cousin        unknown type dx <50, paternal first cosuin  . Leukemia Cousin        maternal first cousin  . Cancer Other        unknown type dx >50, PGM's sister  . Breast cancer Other        dx <50, PGM's niece  . Breast cancer Other 31       paternal second cousin  . Basal cell carcinoma Daughter 9        tanning bed use & sun exposure   Ms. Kruser has two daughters and one son (ages 70-45). Her oldest daughter has a history of basal cell carcinoma diagnosed at age 61. Ms. Michalsky notes that this daughter had excessive sun exposure and used tanning beds. Ms. Burdine has two brothers and had four sisters. None of these siblings have had cancer.  Ms. Mikels mother died at the age of 49 and did not have cancer. Ms. Weight had four maternal aunts and two maternal uncles. One maternal first cousin died from leukemia in his 87s. Her maternal grandmother died at age 66 and her maternal grandfather died in his 40s. There are no other known diagnoses of cancer on the maternal side of the family.  Ms. Carberry father died at the age of 70 and sickle cell anemia. She had one paternal aunt and one paternal uncle. Two paternal first cousins had cancer - one was diagnosed with metastatic prostate cancer at the age of 45, and the other was diagnosed with an unspecified cancer when he was younger than 78. Her paternal grandmother died younger than 8 and her paternal grandfather died at age 98. Her grandmother had two sisters who were diagnosed with unspecified cancers when they were older than 24. Her grandmother had a niece diagnosed with breast cancer younger than 31, and this relative's daughter (patient's second cousin) also had breast cancer diagnosed at the age of 77.  Ms. Millar is unaware of previous family history of genetic testing for hereditary cancer risks. Patient's ancestors are of unknown descent. There is no reported Ashkenazi Jewish ancestry. There is no known consanguinity.  GENETIC COUNSELING ASSESSMENT: Ms. Gover is a 69 y.o. female with a personal history of breast cancer and a family history of metastatic prostate cancer and breast cancer which is somewhat suggestive of a hereditary cancer syndrome and predisposition to cancer. We, therefore, discussed and recommended the following at today's  visit.   DISCUSSION: We discussed that approximately 5-10% of breast cancer is hereditary, with most cases associated with the BRCA1 and BRCA2 genes. There are other genes that can be associated with hereditary breast cancer syndromes. These include ATM, CHEK2, PALB2, etc. We discussed that testing is beneficial for several reasons, including knowing about other cancer risks, identifying potential screening and risk-reduction options that may be appropriate, and to understand if other family members could be at risk  for cancer and allow them to undergo genetic testing.   We reviewed the characteristics, features and inheritance patterns of hereditary cancer syndromes. We also discussed genetic testing, including the appropriate family members to test, the process of testing, insurance coverage and turn-around-time for results. We discussed the implications of a negative, positive and/or variant of uncertain significant result. We recommended Ms. Jalomo pursue genetic testing for the [facility] Common Hereditary Cancers panel.   The Common Hereditary Cancers Panel offered by [facility] includes sequencing and/or deletion duplication testing of the following 48 genes: APC, ATM, AXIN2, BARD1, BMPR1A, BRCA1, BRCA2, BRIP1, CDH1, CDK4, CDKN2A (p14ARF), CDKN2A (p16INK4a), CHEK2, CTNNA1, DICER1, EPCAM (Deletion/duplication testing only), GREM1 (promoter region deletion/duplication testing only), KIT, MEN1, MLH1, MSH2, MSH3, MSH6, MUTYH, NBN, NF1, NTHL1, PALB2, PDGFRA, PMS2, POLD1, POLE, PTEN, RAD50, RAD51C, RAD51D, RNF43, SDHB, SDHC, SDHD, SMAD4, SMARCA4. STK11, TP53, TSC1, TSC2, and VHL.  The following genes are evaluated for sequence changes only: SDHA and HOXB13 c.251G>A variant only.   Based on Ms. Ross's personal and family history of cancer, she meets medical criteria for genetic testing. Despite that she meets criteria, there may still be an out of pocket cost. Ms. Thumm expressed concern regarding the potential  cost of testing. Therefore, we will request a pre-test benefits investigation from the laboratory.  PLAN: To better estimate the out of pocket cost for testing, we will request a pre-test benefits investigation. We will call Ms. Getchell once we hear back from the laboratory and she will decide at that time whether she would like to proceed with testing. In the meantime, we recommend Ms. Bean continue to follow the cancer screening guidelines given by her primary healthcare provider.  Ms. Manke questions were answered to her satisfaction today. Our contact information was provided should additional questions or concerns arise. Thank you for the referral and allowing [place] to share in the care of your patient.   Clint Guy, [place], [facility] Licensed, Certified Dispensing [email or web address] Phone: [phone]  The patient was seen for a total of 60 minutes in face-to-face genetic counseling.  This patient was discussed with Drs. Magrinat, Lindi Adie and/or Burr Medico who agrees with the above.    _______________________________________________________________________ For Office Staff:  Number of people involved in session: 1 Was an Intern/ student involved with case: no

## 2020-01-19 ENCOUNTER — Telehealth: Payer: Self-pay | Admitting: Genetic Counselor

## 2020-01-19 NOTE — Telephone Encounter (Signed)
LVM requesting that she call back to discuss the estimated out of pocket cost for genetic testing.

## 2020-01-19 NOTE — Telephone Encounter (Signed)
Jeanette Krause returned my call regarding the cost of genetic testing. We discussed that the genetic testing laboratory ([facility]) estimates that her out of pocket cost would be $40 based on her insurance plan. [name] feels comfortable proceeding with the genetic testing at this time. We will place the order with [facility] and call her when we have the results.

## 2020-01-28 DIAGNOSIS — Z1379 Encounter for other screening for genetic and chromosomal anomalies: Secondary | ICD-10-CM | POA: Insufficient documentation

## 2020-01-30 ENCOUNTER — Telehealth: Payer: Self-pay | Admitting: Genetic Counselor

## 2020-01-30 NOTE — Telephone Encounter (Signed)
LVM that her genetic test results are available and requested that she call back to discuss them.  

## 2020-02-04 NOTE — Telephone Encounter (Signed)
LVM that her genetic test results are available and requested that she call back to discuss them.  

## 2020-02-05 ENCOUNTER — Telehealth: Payer: Self-pay | Admitting: Genetic Counselor

## 2020-02-05 ENCOUNTER — Ambulatory Visit: Payer: Self-pay | Admitting: Genetic Counselor

## 2020-02-05 DIAGNOSIS — Z1379 Encounter for other screening for genetic and chromosomal anomalies: Secondary | ICD-10-CM

## 2020-02-05 NOTE — Telephone Encounter (Signed)
Revealed negative genetic testing. Discussed that we do not know why she has had breast cancer or why there is cancer in the family. There could be a genetic mutation in the family that Jeanette Krause did not inherit. There could also be a mutation in a different gene that we are not testing, or our current technology may not be able detect certain mutations. It will therefore be important for her to stay in contact with genetics to keep up with whether additional testing may be appropriate in the future.   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 Her result is still considered normal at this time and should not impact her medical management.

## 2020-02-09 ENCOUNTER — Encounter: Payer: Self-pay | Admitting: Genetic Counselor

## 2020-02-09 NOTE — Progress Notes (Signed)
HPI:  Jeanette Krause was previously seen in the [facility] due to a personal and family history of cancer and concerns regarding a hereditary predisposition to cancer. Please refer to our prior cancer genetics clinic note for more information regarding our discussion, assessment and recommendations, at the time. [name] recent genetic test results were disclosed to her, as were recommendations warranted by these results. These results and recommendations are discussed in more detail below.  FAMILY HISTORY:  We obtained a detailed, 4-generation family history.  Significant diagnoses are listed below: Family History  Problem Relation Age of Onset   Sickle cell anemia Father    Diabetes Sister    Heart disease Maternal Aunt    Goiter Paternal Grandmother    Cancer Other        unknown type dx >50, PGM's sister   Aortic dissection Sister    Transient ischemic attack Sister        x6   Heart attack Sister 50   Prostate cancer Cousin 67       metastatic, paternal first cousin   Cancer Cousin        unknown type dx <50, paternal first cosuin   Leukemia Cousin        maternal first cousin   Cancer Other        unknown type dx >50, PGM's sister   Breast cancer Other        dx <50, PGM's niece   Breast cancer Other 67       paternal second cousin   Basal cell carcinoma Daughter 57       tanning bed use & sun exposure   [name] has two daughters and one son (ages 6-45). Her oldest daughter has a history of basal cell carcinoma diagnosed at age 67. [name] notes that this daughter had excessive sun exposure and used tanning beds. [name] has two brothers and had four sisters. None of these siblings have had cancer.  [name] mother died at the age of 48 and did not have cancer. [name] had four maternal aunts and two maternal uncles. One maternal first cousin died from leukemia in his 67s. Her maternal grandmother died at age 67 and her  maternal grandfather died in his 35s. There are no other known diagnoses of cancer on the maternal side of the family.  [name] father died at the age of 70 and sickle cell anemia. She had one paternal aunt and one paternal uncle. Two paternal first cousins had cancer - one was diagnosed with metastatic prostate cancer at the age of 97, and the other was diagnosed with an unspecified cancer when he was younger than 67. Her paternal grandmother died younger than 74 and her paternal grandfather died at age 57. Her grandmother had two sisters who were diagnosed with unspecified cancers when they were older than 67. Her grandmother had a niece diagnosed with breast cancer younger than 53, and this relative's daughter (patient's second cousin) also had breast cancer diagnosed at the age of 67.  [name] is unaware of previous family history of genetic testing for hereditary cancer risks. Patient's ancestors are of unknown descent. There is no reported Ashkenazi Jewish ancestry. There is no known consanguinity.  GENETIC TEST RESULTS: Genetic testing reported out on 01/28/2020 through the [facility] Common Hereditary Cancers panel. No pathogenic variants were detected.   The Common Hereditary Cancers Panel offered by [facility] includes sequencing and/or deletion duplication testing of  the following 48 genes: APC, ATM, AXIN2, BARD1, BMPR1A, BRCA1, BRCA2, BRIP1, CDH1, CDK4, CDKN2A (p14ARF), CDKN2A (p16INK4a), CHEK2, CTNNA1, DICER1, EPCAM (Deletion/duplication testing only), GREM1 (promoter region deletion/duplication testing only), KIT, MEN1, MLH1, MSH2, MSH3, MSH6, MUTYH, NBN, NF1, NTHL1, PALB2, PDGFRA, PMS2, POLD1, POLE, PTEN, RAD50, RAD51C, RAD51D, RNF43, SDHB, SDHC, SDHD, SMAD4, SMARCA4. STK11, TP53, TSC1, TSC2, and VHL.  The following genes were evaluated for sequence changes only: SDHA and HOXB13 c.251G>A variant only. The test report will be scanned into EPIC and located under the Molecular Pathology  section of the Results Review tab.  A portion of the result report is included below for reference.     We discussed with Ms. Curfman that because current genetic testing is not perfect, it is possible there may be a gene mutation in one of these genes that current testing cannot detect, but that chance is small.  We also discussed that there could be another gene that has not yet been discovered, or that we have not yet tested, that is responsible for the cancer diagnoses in the family. It is also possible there is a hereditary cause for the cancer in the family that [name] did not inherit and therefore was not identified in her testing.  Therefore, it is important to remain in touch with cancer genetics in the future so that we can continue to offer Ms. Mohs the most up to date genetic testing.   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  At this time, it is unknown if this variant is associated with increased cancer risk or if this is a normal finding, but most variants such as this get reclassified to being inconsequential. It should not be used to make medical management decisions. With time, we suspect the lab will determine the significance of this variant, if any. If we do learn more about it, we will try to contact [name] to discuss it further. However, it is important to stay in touch with [place] periodically and keep the address and phone number up to date.  CANCER SCREENING RECOMMENDATIONS: Ms. Beringer test result is considered negative (normal).  This means that we have not identified a hereditary cause for her personal and family history of cancer at this time. While reassuring, this does not definitively rule out a hereditary predisposition to cancer. It is still possible that there could be genetic mutations that are undetectable by current technology. There could be genetic mutations in genes that have not  been tested or identified to increase cancer risk.  Therefore, it is recommended she continue to follow the cancer management and screening guidelines provided by her oncology and primary healthcare provider.   An individual's cancer risk and medical management are not determined by genetic test results alone. Overall cancer risk assessment incorporates additional factors, including personal medical history, family history, and any available genetic information that may result in a personalized plan for cancer prevention and surveillance.  RECOMMENDATIONS FOR FAMILY MEMBERS:  Individuals in this family might be at some increased risk of developing cancer, over the general population risk, simply due to the family history of cancer.  We recommended women in this family have a yearly mammogram beginning at age 62, or 70 years younger than the earliest onset of cancer, an annual clinical breast exam, and perform monthly breast self-exams. Women in this family should also have a gynecological exam as recommended by their primary provider. All family members  should be referred for colonoscopy starting at age 103.  FOLLOW-UP: Lastly, we discussed with Ms. Bronaugh that cancer genetics is a rapidly advancing field and it is possible that new genetic tests will be appropriate for her and/or her family members in the future. We encouraged her to remain in contact with cancer genetics on an annual basis so we can update her personal and family histories and let her know of advances in cancer genetics that may benefit this family.   Our contact number was provided. Ms. Pfluger questions were answered to her satisfaction, and she knows she is welcome to call us at anytime with additional questions or concerns.   Clint Guy, MS, [facility] Genetic Counselor [email or web address] Phone: [phone]

## 2020-11-25 DIAGNOSIS — Z08 Encounter for follow-up examination after completed treatment for malignant neoplasm: Secondary | ICD-10-CM | POA: Diagnosis not present

## 2020-11-25 DIAGNOSIS — Z853 Personal history of malignant neoplasm of breast: Secondary | ICD-10-CM | POA: Diagnosis not present

## 2020-11-25 DIAGNOSIS — H401131 Primary open-angle glaucoma, bilateral, mild stage: Secondary | ICD-10-CM | POA: Diagnosis not present

## 2020-11-25 DIAGNOSIS — Z9221 Personal history of antineoplastic chemotherapy: Secondary | ICD-10-CM | POA: Diagnosis not present

## 2020-11-25 DIAGNOSIS — Z1231 Encounter for screening mammogram for malignant neoplasm of breast: Secondary | ICD-10-CM | POA: Diagnosis not present

## 2020-11-25 DIAGNOSIS — Z923 Personal history of irradiation: Secondary | ICD-10-CM | POA: Diagnosis not present

## 2020-11-25 DIAGNOSIS — Z9889 Other specified postprocedural states: Secondary | ICD-10-CM | POA: Diagnosis not present

## 2021-01-25 DIAGNOSIS — K1120 Sialoadenitis, unspecified: Secondary | ICD-10-CM | POA: Diagnosis not present

## 2021-01-27 DIAGNOSIS — H5789 Other specified disorders of eye and adnexa: Secondary | ICD-10-CM | POA: Diagnosis not present

## 2021-01-27 DIAGNOSIS — H1031 Unspecified acute conjunctivitis, right eye: Secondary | ICD-10-CM | POA: Diagnosis not present

## 2021-02-14 DIAGNOSIS — Z9189 Other specified personal risk factors, not elsewhere classified: Secondary | ICD-10-CM | POA: Diagnosis not present

## 2021-02-14 DIAGNOSIS — Z853 Personal history of malignant neoplasm of breast: Secondary | ICD-10-CM | POA: Diagnosis not present

## 2021-03-30 DIAGNOSIS — H35369 Drusen (degenerative) of macula, unspecified eye: Secondary | ICD-10-CM | POA: Diagnosis not present

## 2021-03-30 DIAGNOSIS — H35413 Lattice degeneration of retina, bilateral: Secondary | ICD-10-CM | POA: Diagnosis not present

## 2021-03-30 DIAGNOSIS — H401131 Primary open-angle glaucoma, bilateral, mild stage: Secondary | ICD-10-CM | POA: Diagnosis not present

## 2021-05-04 DIAGNOSIS — H401131 Primary open-angle glaucoma, bilateral, mild stage: Secondary | ICD-10-CM | POA: Diagnosis not present

## 2021-06-10 DIAGNOSIS — Z9011 Acquired absence of right breast and nipple: Secondary | ICD-10-CM | POA: Diagnosis not present

## 2021-06-10 DIAGNOSIS — C50911 Malignant neoplasm of unspecified site of right female breast: Secondary | ICD-10-CM | POA: Diagnosis not present

## 2021-06-10 DIAGNOSIS — C50411 Malignant neoplasm of upper-outer quadrant of right female breast: Secondary | ICD-10-CM | POA: Diagnosis not present

## 2021-06-10 DIAGNOSIS — M8580 Other specified disorders of bone density and structure, unspecified site: Secondary | ICD-10-CM | POA: Diagnosis not present

## 2021-06-10 DIAGNOSIS — Z79811 Long term (current) use of aromatase inhibitors: Secondary | ICD-10-CM | POA: Diagnosis not present

## 2021-06-10 DIAGNOSIS — Z9221 Personal history of antineoplastic chemotherapy: Secondary | ICD-10-CM | POA: Diagnosis not present

## 2021-06-10 DIAGNOSIS — R232 Flushing: Secondary | ICD-10-CM | POA: Diagnosis not present

## 2021-06-10 DIAGNOSIS — Z923 Personal history of irradiation: Secondary | ICD-10-CM | POA: Diagnosis not present

## 2021-06-10 DIAGNOSIS — Z170 Estrogen receptor positive status [ER+]: Secondary | ICD-10-CM | POA: Diagnosis not present

## 2021-09-08 DIAGNOSIS — H401131 Primary open-angle glaucoma, bilateral, mild stage: Secondary | ICD-10-CM | POA: Diagnosis not present

## 2021-09-08 DIAGNOSIS — H53412 Scotoma involving central area, left eye: Secondary | ICD-10-CM | POA: Diagnosis not present

## 2021-10-05 DIAGNOSIS — E6609 Other obesity due to excess calories: Secondary | ICD-10-CM | POA: Diagnosis not present

## 2021-10-05 DIAGNOSIS — E039 Hypothyroidism, unspecified: Secondary | ICD-10-CM | POA: Diagnosis not present

## 2021-10-05 DIAGNOSIS — Z6832 Body mass index (BMI) 32.0-32.9, adult: Secondary | ICD-10-CM | POA: Diagnosis not present

## 2021-10-05 DIAGNOSIS — C50911 Malignant neoplasm of unspecified site of right female breast: Secondary | ICD-10-CM | POA: Diagnosis not present

## 2021-10-05 DIAGNOSIS — Z0000 Encounter for general adult medical examination without abnormal findings: Secondary | ICD-10-CM | POA: Diagnosis not present

## 2021-10-05 DIAGNOSIS — E785 Hyperlipidemia, unspecified: Secondary | ICD-10-CM | POA: Diagnosis not present

## 2021-10-05 DIAGNOSIS — M8580 Other specified disorders of bone density and structure, unspecified site: Secondary | ICD-10-CM | POA: Diagnosis not present

## 2021-10-05 DIAGNOSIS — R7303 Prediabetes: Secondary | ICD-10-CM | POA: Diagnosis not present

## 2021-12-01 DIAGNOSIS — Z853 Personal history of malignant neoplasm of breast: Secondary | ICD-10-CM | POA: Diagnosis not present

## 2021-12-01 DIAGNOSIS — Z79811 Long term (current) use of aromatase inhibitors: Secondary | ICD-10-CM | POA: Diagnosis not present

## 2021-12-01 DIAGNOSIS — Z08 Encounter for follow-up examination after completed treatment for malignant neoplasm: Secondary | ICD-10-CM | POA: Diagnosis not present

## 2021-12-01 DIAGNOSIS — C50912 Malignant neoplasm of unspecified site of left female breast: Secondary | ICD-10-CM | POA: Diagnosis not present

## 2021-12-01 DIAGNOSIS — Z9889 Other specified postprocedural states: Secondary | ICD-10-CM | POA: Diagnosis not present

## 2021-12-01 DIAGNOSIS — Z1231 Encounter for screening mammogram for malignant neoplasm of breast: Secondary | ICD-10-CM | POA: Diagnosis not present

## 2022-01-04 DIAGNOSIS — Z923 Personal history of irradiation: Secondary | ICD-10-CM | POA: Diagnosis not present

## 2022-01-04 DIAGNOSIS — Z9221 Personal history of antineoplastic chemotherapy: Secondary | ICD-10-CM | POA: Diagnosis not present

## 2022-01-04 DIAGNOSIS — Z170 Estrogen receptor positive status [ER+]: Secondary | ICD-10-CM | POA: Diagnosis not present

## 2022-01-04 DIAGNOSIS — C50911 Malignant neoplasm of unspecified site of right female breast: Secondary | ICD-10-CM | POA: Diagnosis not present

## 2022-01-04 DIAGNOSIS — N951 Menopausal and female climacteric states: Secondary | ICD-10-CM | POA: Diagnosis not present

## 2022-01-04 DIAGNOSIS — M8588 Other specified disorders of bone density and structure, other site: Secondary | ICD-10-CM | POA: Diagnosis not present

## 2022-01-04 DIAGNOSIS — Z79811 Long term (current) use of aromatase inhibitors: Secondary | ICD-10-CM | POA: Diagnosis not present

## 2022-01-04 DIAGNOSIS — Z08 Encounter for follow-up examination after completed treatment for malignant neoplasm: Secondary | ICD-10-CM | POA: Diagnosis not present

## 2022-01-04 DIAGNOSIS — Z853 Personal history of malignant neoplasm of breast: Secondary | ICD-10-CM | POA: Diagnosis not present

## 2022-01-25 DIAGNOSIS — H2513 Age-related nuclear cataract, bilateral: Secondary | ICD-10-CM | POA: Diagnosis not present

## 2022-01-25 DIAGNOSIS — H53413 Scotoma involving central area, bilateral: Secondary | ICD-10-CM | POA: Diagnosis not present

## 2022-01-25 DIAGNOSIS — H43813 Vitreous degeneration, bilateral: Secondary | ICD-10-CM | POA: Diagnosis not present

## 2022-01-25 DIAGNOSIS — H401131 Primary open-angle glaucoma, bilateral, mild stage: Secondary | ICD-10-CM | POA: Diagnosis not present

## 2022-07-07 DIAGNOSIS — Z01419 Encounter for gynecological examination (general) (routine) without abnormal findings: Secondary | ICD-10-CM | POA: Diagnosis not present

## 2022-07-07 DIAGNOSIS — Z853 Personal history of malignant neoplasm of breast: Secondary | ICD-10-CM | POA: Diagnosis not present

## 2022-07-07 DIAGNOSIS — M8580 Other specified disorders of bone density and structure, unspecified site: Secondary | ICD-10-CM | POA: Diagnosis not present

## 2022-07-07 DIAGNOSIS — N952 Postmenopausal atrophic vaginitis: Secondary | ICD-10-CM | POA: Diagnosis not present

## 2022-07-10 ENCOUNTER — Other Ambulatory Visit: Payer: Self-pay | Admitting: Obstetrics and Gynecology

## 2022-07-10 DIAGNOSIS — M8580 Other specified disorders of bone density and structure, unspecified site: Secondary | ICD-10-CM

## 2022-08-01 DIAGNOSIS — H53433 Sector or arcuate defects, bilateral: Secondary | ICD-10-CM | POA: Diagnosis not present

## 2022-08-01 DIAGNOSIS — H401131 Primary open-angle glaucoma, bilateral, mild stage: Secondary | ICD-10-CM | POA: Diagnosis not present

## 2022-10-25 DIAGNOSIS — N3000 Acute cystitis without hematuria: Secondary | ICD-10-CM | POA: Diagnosis not present

## 2022-11-22 DIAGNOSIS — Z0000 Encounter for general adult medical examination without abnormal findings: Secondary | ICD-10-CM | POA: Diagnosis not present

## 2022-11-22 DIAGNOSIS — E782 Mixed hyperlipidemia: Secondary | ICD-10-CM | POA: Diagnosis not present

## 2022-11-22 DIAGNOSIS — R7303 Prediabetes: Secondary | ICD-10-CM | POA: Diagnosis not present

## 2022-11-22 DIAGNOSIS — M8580 Other specified disorders of bone density and structure, unspecified site: Secondary | ICD-10-CM | POA: Diagnosis not present

## 2022-11-22 DIAGNOSIS — E039 Hypothyroidism, unspecified: Secondary | ICD-10-CM | POA: Diagnosis not present

## 2022-12-02 DIAGNOSIS — Z780 Asymptomatic menopausal state: Secondary | ICD-10-CM | POA: Diagnosis not present

## 2022-12-02 DIAGNOSIS — Z13820 Encounter for screening for osteoporosis: Secondary | ICD-10-CM | POA: Diagnosis not present

## 2022-12-07 DIAGNOSIS — Z1231 Encounter for screening mammogram for malignant neoplasm of breast: Secondary | ICD-10-CM | POA: Diagnosis not present

## 2022-12-07 DIAGNOSIS — Z853 Personal history of malignant neoplasm of breast: Secondary | ICD-10-CM | POA: Diagnosis not present

## 2023-01-18 ENCOUNTER — Other Ambulatory Visit

## 2023-01-30 DIAGNOSIS — H52223 Regular astigmatism, bilateral: Secondary | ICD-10-CM | POA: Diagnosis not present

## 2023-01-30 DIAGNOSIS — H524 Presbyopia: Secondary | ICD-10-CM | POA: Diagnosis not present

## 2023-01-30 DIAGNOSIS — H401131 Primary open-angle glaucoma, bilateral, mild stage: Secondary | ICD-10-CM | POA: Diagnosis not present

## 2023-01-30 DIAGNOSIS — H5213 Myopia, bilateral: Secondary | ICD-10-CM | POA: Diagnosis not present

## 2023-02-22 DIAGNOSIS — Z923 Personal history of irradiation: Secondary | ICD-10-CM | POA: Diagnosis not present

## 2023-02-22 DIAGNOSIS — G620 Drug-induced polyneuropathy: Secondary | ICD-10-CM | POA: Diagnosis not present

## 2023-02-22 DIAGNOSIS — Z08 Encounter for follow-up examination after completed treatment for malignant neoplasm: Secondary | ICD-10-CM | POA: Diagnosis not present

## 2023-02-22 DIAGNOSIS — T451X5A Adverse effect of antineoplastic and immunosuppressive drugs, initial encounter: Secondary | ICD-10-CM | POA: Diagnosis not present

## 2023-02-22 DIAGNOSIS — Z9221 Personal history of antineoplastic chemotherapy: Secondary | ICD-10-CM | POA: Diagnosis not present

## 2023-02-22 DIAGNOSIS — Z853 Personal history of malignant neoplasm of breast: Secondary | ICD-10-CM | POA: Diagnosis not present

## 2023-02-26 DIAGNOSIS — C50911 Malignant neoplasm of unspecified site of right female breast: Secondary | ICD-10-CM | POA: Diagnosis not present

## 2023-03-03 DIAGNOSIS — Z860100 Personal history of colon polyps, unspecified: Secondary | ICD-10-CM | POA: Diagnosis not present

## 2023-03-03 DIAGNOSIS — Z1211 Encounter for screening for malignant neoplasm of colon: Secondary | ICD-10-CM | POA: Diagnosis not present

## 2023-03-03 DIAGNOSIS — K5140 Inflammatory polyps of colon without complications: Secondary | ICD-10-CM | POA: Diagnosis not present

## 2023-03-03 DIAGNOSIS — K635 Polyp of colon: Secondary | ICD-10-CM | POA: Diagnosis not present
